# Patient Record
Sex: Male | Born: 1975 | Race: White | Hispanic: No | Marital: Single
Health system: Southern US, Community
[De-identification: ages and names within clinical notes are randomized; demographics above are authoritative.]

## PROBLEM LIST (undated history)

## (undated) DIAGNOSIS — N50812 Left testicular pain: Secondary | ICD-10-CM

## (undated) MED FILL — ONDANSETRON HCL 4MG TABS: 4 MG | 10 days supply | Qty: 10 | Fill #0 | Status: AC

## (undated) MED FILL — PAXLOVID 20 X 150 MG & TBPK: 20 x 150 MG & 10 x 100MG | 5 days supply | Qty: 30 | Fill #0 | Status: AC

---

## 2014-02-05 ENCOUNTER — Ambulatory Visit
Admit: 2014-02-05 | Discharge: 2014-02-05 | Payer: PRIVATE HEALTH INSURANCE | Attending: Specialist | Primary: Family Medicine

## 2014-02-05 DIAGNOSIS — Z3009 Encounter for other general counseling and advice on contraception: Secondary | ICD-10-CM

## 2014-02-05 MED ORDER — DIAZEPAM 10 MG PO TABS
10 MG | ORAL_TABLET | Freq: Once | ORAL | Status: AC
Start: 2014-02-05 — End: 2014-02-05

## 2014-02-05 MED ORDER — CEPHALEXIN 500 MG PO CAPS
500 MG | ORAL_CAPSULE | Freq: Three times a day (TID) | ORAL | Status: DC
Start: 2014-02-05 — End: 2015-03-09

## 2014-02-05 MED ORDER — IBUPROFEN 800 MG PO TABS
800 MG | ORAL_TABLET | Freq: Three times a day (TID) | ORAL | Status: DC | PRN
Start: 2014-02-05 — End: 2015-03-09

## 2014-02-05 NOTE — Progress Notes (Signed)
Tama Headings Sande Brothers, MD [facility]    Urology Consultation / New Patient Visit    Patient:  Jeremiah Goodman  Date of Birth: 07-28-1975  Date: 02/05/2014    HISTORY OF PRESENT ILLNESS:   The patient is a 39 y.o. male who presents today for evaluation of the following problems:   Request for sterilization via vasectomy:  The patient is here today to discuss a bilateral vasectomy for sterilization.  I explained to him that a vasectomy is one of the best methods of sterilization, but it is not a guarantee of sterilization.  I went into great detail concerning the method of performing a vasectomy in the office setting and the hospital.      Marital Status: married  Number of Children: 3  Current Method of Birth Control: wife pregnant  Spouse currently pregnant? yes - due in May  Personal History of Scrotal or Testicular trauma? no    Vasectomy Discussion:  The patient and I had a long discussion in regards to vasectomy.  The patient understands he is not safe for unprotected intercourse until I have personally viewed at least one semen specimen after vasectomy showing no sperm.  He was told he will need to wait at least 1 month or 15-20 ejaculations (which ever comes LAST) before bringing in a semen specimen after vasectomy. He understands this will be an outpatient procedure.  He understands that there is a possibility of recanalization of the vas deferens creating fertility (less than 1 per 1000).   He also understands that he should use ice, elevation, antiinflammatories and inactivity for two days following the vasectomy; no intercourse or exercise for at least 1 week.  He understands complication of: 1) swelling and bruising in the scrotal area and the testicles, 2) bleeding and hematoma formation, possibly requiring hospitalization and surgical drainage, 3) loss of a testicle, 4) infection, and 5) pain.  He understands long-term complications including: Possible recanalization of the vas producing fertility or chronic  pain whether it is minor or significant, even requiring surgical procedures including orchiectomy.  He understands that although this is a very effective procedure for sterilization that it is not 100% effective.       Associated Symptoms: No dysuria, gross hematuria.  Pain Severity: Pain Score:   0 - No pain/10    (Patient's old records have been requested, reviewed and summarized in today's note.)    Summary of old records:   Desires Bilateral Vasectomy    Procedures Today: N/A    Last several PSA's:  No results found for: PSA  Last total testosterone:  No results found for: TESTOSTERONE  Urinalysis today:  No results found for this visit on 02/05/14.    AUA Symptom Score (02/05/2014):  INCOMPLETE EMPTYING: How often have you had the sensation of not emptying your bladder?: Not at all  FREQUENCY: How often do you have to urinate less than every two hours?: About Half the time  INTERMITTENCY: How often have you found you stopped and started again several times when you urinated?: Not at all  URGENCY: How often have you found it difficult to postpone urination?: Not at all  WEAK STREAM: How often have you had a weak urinary stream?: Less than 1 to 5 times  STRAINING: How often have you had to strain to start  urination?: Not at all  NOCTURIA: How many times did you typically get up at night to uriniate?: NONE  TOTAL I-PSS SCORE:: 4  How would you feel if  you were to spend the rest of your life with your urinary condition?: Pleased    Last BUN and creatinine:  No results found for: BUN  No results found for: CREATININE    Additional Lab/Culture results: none    Imaging Reviewed during this Office Visit: none  (results were independently reviewed by physician and radiology report verified)    PAST MEDICAL, FAMILY AND SOCIAL HISTORY:  Past Medical History   Diagnosis Date   ??? Diverticulitis    ??? GERD (gastroesophageal reflux disease)    ??? Asthma    ??? Melanoma (HCC)    ??? Caffeine use      2 cans pop per day     Past  Surgical History   Procedure Laterality Date   ??? Colonoscopy     ??? Knee surgery       No family history on file.  Current Outpatient Prescriptions   Medication Sig Dispense Refill   ??? Multiple Vitamins-Minerals (THERAPEUTIC MULTIVITAMIN-MINERALS) tablet Take 1 tablet by mouth daily     ??? psyllium (METAMUCIL) 58.6 % packet Take 1 packet by mouth daily     ??? ibuprofen (ADVIL;MOTRIN) 800 MG tablet Take 1 tablet by mouth every 8 hours as needed for Pain Take 1 tab 1/2 prior to procedure and then every 8 hours after PRN 22 tablet 0   ??? cephALEXin (KEFLEX) 500 MG capsule Take 1 capsule by mouth 3 times daily 21 capsule 0   ??? diazepam (VALIUM) 10 MG tablet Take 1 tablet by mouth once for 1 dose Take 1 tablet 1/2 hour before procedure 1 tablet 0   ??? DEXILANT 60 MG CPDR capsule   0   ??? PROAIR HFA 108 (90 BASE) MCG/ACT inhaler   0   ??? hydrOXYzine (ATARAX) 10 MG/5ML syrup   0     No current facility-administered medications for this visit.       Other  History   Smoking status   ??? Never Smoker    Smokeless tobacco   ??? Never Used      (If patient a smoker, smoking cessation counseling offered)   History   Alcohol Use   ??? 0.6 oz/week   ??? 1 Not specified per week       REVIEW OF SYSTEMS:  Constitutional: negative  Eyes: positive for  glasses  Neurological: negative  Endocrine: negative  Gastrointestinal: negative  Cardiovascular: negative  Skin: negative   Musculoskeletal: negative  Ears/Nose/Throat: negative  Respiratory: negative  Hematological/Lymphatic: negative  Psychological: negative    Physical Exam:    This a 39 y.o. male   Filed Vitals:    02/05/14 1403   BP: 109/78   Pulse: 105     Body mass index is 30.11 kg/(m^2).  Constitutional: Patient in no acute distress;   Neuro: alert and oriented to person place and time.    Psych: Mood and affect normal.  Skin: Normal  Lungs: Respiratory effort normal  Cardiovascular:  Normal peripheral pulses  Abdomen: Soft, non-tender, non-distended with no CVA, flank pain,  hepatosplenomegaly or hernia.  Kidneys normal.  Bladder non-tender and not distended.  Lymphatics: no palpable lymphadenopathy  Penis normal and circumcised  Urethral meatus normal  Scrotal exam normal  Testicles normal bilaterally  Epididymis normal bilaterally  No evidence of inguinal hernia       Assessment and Plan      1. Sterilization consult           Plan:  Return for Vasectomy.  After discussing the role of bilateral vasectomy for sterilization in detail, the patient would like to proceed and will be scheduled under local anesthesia in my office in the near future.  Valium 10 mg po 1/2 hour prior to vasectomy for anxiety.  Motrin 800 mg po 1/2 hour prior to vasectomy and postop for pain.        Tama HeadingsGregory D. Vang Kraeger, MD FACS  02/05/2014 2:11 PM    2016 PQRS Documentation: N/A

## 2014-02-14 ENCOUNTER — Ambulatory Visit: Payer: Self-pay | Admitting: *Deleted

## 2014-04-04 ENCOUNTER — Ambulatory Visit
Admit: 2014-04-04 | Discharge: 2014-04-04 | Payer: PRIVATE HEALTH INSURANCE | Attending: Specialist | Primary: Family Medicine

## 2014-04-04 DIAGNOSIS — Z302 Encounter for sterilization: Secondary | ICD-10-CM

## 2014-04-04 MED ORDER — HYDROCODONE-ACETAMINOPHEN 7.5-325 MG PO TABS
ORAL_TABLET | Freq: Four times a day (QID) | ORAL | Status: DC | PRN
Start: 2014-04-04 — End: 2015-03-09

## 2014-04-04 NOTE — Progress Notes (Signed)
Tama Headings Sande Brothers, MD FACS    Urology Office Progress Note    Patient:  Jeremiah Goodman  Date of Birth: 1975-04-24  Date: 04/04/2014    HISTORY OF PRESENT ILLNESS:   The patient is a 39 y.o. male who was last seen on 02/05/14.    Elective Sterilization via Vasectomy  Patient presents today for follow-up for the following problems: elective sterilization via vasectomy.    Vasectomy Op Note    Surgeon: Tama Headings. Omarri Eich, MD, FACS  Procedure: Bilateral Vasectomy  Indications:  The patient has elected to proceed with bilateral vasectomy for purposes of sterilization.  The risks and benefits were discussed in detail, informed consent obtained and the patient wishes to proceed.    Procedure:  The patient was prepped and draped in the usual sterile fashion.  While in the supine position, the left vas deferens was isolated and the skin and vas were anesthetized using 1% lidocaine with epinephrine.  A small incision was made in the skin using a scalpel.  The vas was delivered up into the wound and a 2 cm segment was freed up and excised.  The proximal end was fulgerated and the distal end was suture ligated with a 3-0 Chromic suture.  The ends of the vas were placed back into the scrotum and the skin was closed with an interrupted 3-0 Chromic suture.    The right vas deferens was isolated and the skin and vas were anesthetized using 1% lidocaine with epinephrine.  A small incision was made in the skin using a scalpel.  The vas was delivered up into the wound and a 2 cm segment was freed up and excised.  The proximal end was fulgerated and the distal end was suture ligated with a 3-0 Chromic suture.  The ends of the vas were placed back into the scrotum and the skin was closed with an interrupted 3-0 Chromic suture.    The patient tolerated the procedure well and was discharged to home.    Post-Vasectomy Instructions:  Immediately after surgery the patient was instructed to be inactive for 48 hours using ice, elevation,  analgesics and to take all his antibiotics as prescribed.  He was told to avoid exercise, heavy lifting and sexual intercourse for the 1st week after his vasectomy.  The patient was told to use protected intercourse until he brings in a semen sample for my review.  He was told to wait at least 1 month or 15 ejaculations whichever comes last before bringing in a sample.    Associated Symptoms: No dysuria, gross hematuria.  Pain Severity: Pain Score:   0 - No pain/10    Summary of old records:   Bilateral Vasectomy 04/04/14    Additional History: none    Procedures Today: N/A    Urinalysis today:  No results found for this visit on 04/04/14.  Last several PSA's:  No results found for: PSA  Last total testosterone:  No results found for: TESTOSTERONE        Last BUN and creatinine:  No results found for: BUN  No results found for: CREATININE    Additional Lab/Culture results: none    Imaging Reviewed during this Office Visit: none  (results were independently reviewed by physician and radiology report verified)    PAST MEDICAL, FAMILY AND SOCIAL HISTORY UPDATE:  Past Medical History   Diagnosis Date   ??? Diverticulitis    ??? GERD (gastroesophageal reflux disease)    ??? Asthma    ???  Melanoma (HCC)    ??? Caffeine use      2 cans pop per day     Past Surgical History   Procedure Laterality Date   ??? Colonoscopy     ??? Knee surgery     ??? Vasectomy Bilateral 04/04/14     Family History   Problem Relation Age of Onset   ??? Family history unknown: Yes     Current Outpatient Prescriptions   Medication Sig Dispense Refill   ??? HYDROcodone-acetaminophen (NORCO) 7.5-325 MG per tablet Take 1 tablet by mouth every 6 hours as needed for Pain 24 tablet 0   ??? DEXILANT 60 MG CPDR capsule   0   ??? PROAIR HFA 108 (90 BASE) MCG/ACT inhaler   0   ??? hydrOXYzine (ATARAX) 10 MG/5ML syrup   0   ??? Multiple Vitamins-Minerals (THERAPEUTIC MULTIVITAMIN-MINERALS) tablet Take 1 tablet by mouth daily     ??? psyllium (METAMUCIL) 58.6 % packet Take 1 packet by  mouth daily     ??? ibuprofen (ADVIL;MOTRIN) 800 MG tablet Take 1 tablet by mouth every 8 hours as needed for Pain Take 1 tab 1/2 prior to procedure and then every 8 hours after PRN 22 tablet 0   ??? cephALEXin (KEFLEX) 500 MG capsule Take 1 capsule by mouth 3 times daily 21 capsule 0     No current facility-administered medications for this visit.       Other  History   Smoking status   ??? Never Smoker    Smokeless tobacco   ??? Never Used     (If patient a smoker, smoking cessation counseling offered)    History   Alcohol Use   ??? 0.6 oz/week   ??? 1 Not specified per week       REVIEW OF SYSTEMS:  Constitutional: negative  Eyes: negative  Neurological: negative  Endocrine: negative  Gastrointestinal: negative  Cardiovascular: negative  Skin: negative   Musculoskeletal: negative  Ears/Nose/Throat: negative  Respiratory: negative  Hematological/Lymphatic: negative  Psychological: negative    Physical Exam:    There were no vitals filed for this visit.  Body mass index is 30.11 kg/(m^2).  Patient is a 39 y.o. male in no acute distress and alert and oriented to person, place and time.      Assessment and Plan      1. Encounter for sterilization           Plan:      Return in 3 weeks for a  postop check, for follow up.  Patient is s/p vasectomy today  He was told to restrict his activity for the next 48-72 hours.  He was told that he and his wife should continue using birth control protection until he brings back a semen sample which demonstrates no residual sperm (after 15 ejaculations or 1 month whichever comes last).         Tama HeadingsGregory D. Mashonda Broski, MD FACS  04/04/2014 1:33 PM    2016 PQRS Documentation: N/A

## 2014-04-25 ENCOUNTER — Ambulatory Visit
Admit: 2014-04-25 | Discharge: 2014-04-25 | Payer: PRIVATE HEALTH INSURANCE | Attending: Specialist | Primary: Family Medicine

## 2014-04-25 DIAGNOSIS — Z9852 Vasectomy status: Secondary | ICD-10-CM

## 2014-04-25 NOTE — Progress Notes (Signed)
Tama HeadingsGregory D. Sande BrothersHaselhuhn, MD FACS    Urology Postoperative Note    Patient:  Jeremiah HopeMichael Seivert  Date of Birth: January 08, 1976  Date: 04/25/2014    HISTORY OF PRESENT ILLNESS:   Patient is doing well s/p vasectomy 04/04/14.  Pain Severity: Pain Score:   0 - No pain/10    Summary of old records:   Bilateral Vasectomy 04/04/14    Urinalysis today:  No results found for this visit on 04/25/14.      PAST MEDICAL, FAMILY AND SOCIAL HISTORY UPDATE:  Past Medical History   Diagnosis Date   ??? Diverticulitis    ??? GERD (gastroesophageal reflux disease)    ??? Asthma    ??? Melanoma (HCC)    ??? Caffeine use      2 cans pop per day     Past Surgical History   Procedure Laterality Date   ??? Colonoscopy     ??? Knee surgery     ??? Vasectomy Bilateral 04/04/14     Family History   Problem Relation Age of Onset   ??? Family history unknown: Yes     Current Outpatient Prescriptions   Medication Sig Dispense Refill   ??? azithromycin (ZITHROMAX) 250 MG tablet   0   ??? predniSONE (DELTASONE) 20 MG tablet   0   ??? DYMISTA 137-50 MCG/ACT SUSP   0   ??? DEXILANT 60 MG CPDR capsule   0   ??? PROAIR HFA 108 (90 BASE) MCG/ACT inhaler   0   ??? hydrOXYzine (ATARAX) 10 MG/5ML syrup   0   ??? Multiple Vitamins-Minerals (THERAPEUTIC MULTIVITAMIN-MINERALS) tablet Take 1 tablet by mouth daily     ??? psyllium (METAMUCIL) 58.6 % packet Take 1 packet by mouth daily     ??? ibuprofen (ADVIL;MOTRIN) 800 MG tablet Take 1 tablet by mouth every 8 hours as needed for Pain Take 1 tab 1/2 prior to procedure and then every 8 hours after PRN 22 tablet 0   ??? HYDROcodone-acetaminophen (NORCO) 7.5-325 MG per tablet Take 1 tablet by mouth every 6 hours as needed for Pain 24 tablet 0   ??? cephALEXin (KEFLEX) 500 MG capsule Take 1 capsule by mouth 3 times daily 21 capsule 0     No current facility-administered medications for this visit.       Other  History   Smoking status   ??? Never Smoker    Smokeless tobacco   ??? Never Used       History   Alcohol Use   ??? 0.6 oz/week   ??? 1 Not specified per week        Physical Exam:    There were no vitals filed for this visit.  There is no weight on file to calculate BMI.  Patient is a 39 y.o. male in no acute distress and alert and oriented to person, place and time.      Assessment and Plan      1. S/P vasectomy           Plan:      Return if symptoms worsen or fail to improve, for follow up.  He was told that he and his wife should continue using birth control protection until he brings back a semen sample which demonstrates no residual sperm (after 15 ejaculations or 1 month whichever comes last).       Tama HeadingsGregory D. Yousof Alderman, MD FACS  04/25/2014 9:42 AM    2016 PQRS Documentation: N/A

## 2014-06-18 NOTE — Telephone Encounter (Signed)
Semen analysis negative for sperm; notify patient.

## 2014-06-18 NOTE — Telephone Encounter (Signed)
PT. NOTIFIED-KSE

## 2014-06-18 NOTE — Telephone Encounter (Signed)
PT. DROPPED OFF SPECIMEN FOR SEMEN ANALYSIS-KSE

## 2015-03-09 ENCOUNTER — Ambulatory Visit
Admit: 2015-03-09 | Discharge: 2015-03-09 | Payer: PRIVATE HEALTH INSURANCE | Attending: Specialist | Primary: Family Medicine

## 2015-03-09 DIAGNOSIS — N50812 Left testicular pain: Secondary | ICD-10-CM

## 2015-03-09 LAB — POCT URINALYSIS DIPSTICK W/O MICROSCOPE (AUTO)
Glucose, UA POC: NEGATIVE
Leukocytes, UA: NEGATIVE
Nitrite, UA: NEGATIVE
Protein, UA POC: NEGATIVE

## 2015-03-09 MED ORDER — CIPROFLOXACIN HCL 500 MG PO TABS
500 MG | ORAL_TABLET | Freq: Two times a day (BID) | ORAL | 0 refills | Status: DC
Start: 2015-03-09 — End: 2015-05-18

## 2015-03-09 NOTE — Progress Notes (Signed)
Tama Headings Sande Brothers, MD FACS    Urology Office Progress Note    Patient:  Jeremiah Goodman  Date of Birth: November 07, 1975  Date: 03/09/2015    HISTORY OF PRESENT ILLNESS:   The patient is a 40 y.o. male who was last seen on 04-25-2014.    Scrotal / Testicular Pain:  Patient is here today for a scrotal/testicular pain which was first noted 3 week(s) ago.    The pain is: left.  Recently the symptoms: show no change  Current medical Rx for pain: none  Recent imaging results: none  Lower urinary tract symptoms: urgency, frequency and decreased urinary stream  Associated Symptoms: No dysuria, gross hematuria.  Pain Severity: Pain Score:   7/10    Summary of old records:  Bilateral Vasectomy 04/04/14  Painful left sperm granuloma: Rx with Cipro for 1 month, sitz baths and Aleve bid    Additional History: Patient presents with a 3 week history of pain in left testicle.  His is s/p bilateral vasectomy 04/04/14 and appears to have a painful left sperm granuloma.  I recommended Cipro 500 mg po bid for one month, Aleve bid for 10 days then prn thereafter.  Also recommended Sitz baths as needed for symptomatic relief.     Procedures Today: N/A    Urinalysis today:  Results for POC orders placed in visit on 03/09/15   POCT Urinalysis No Micro (Auto)   Result Value Ref Range    Color, UA yellow     Clarity, UA clear     Glucose, UA POC negative     Bilirubin, UA      Ketones, UA      Spec Grav, UA      Blood, UA POC trace-intact     pH, UA      Protein, UA POC negative     Urobilinogen, UA      Leukocytes, UA negative     Nitrite, UA negative      Last several PSA's:  No results found for: PSA  Last total testosterone:  No results found for: TESTOSTERONE    AUA Symptom Score (03/09/2015):  INCOMPLETE EMPTYING: How often have you had the sensation of not emptying your bladder?: Less than 1 to 5 times  FREQUENCY: How often do you have to urinate less than every two hours?: More than Half the time  INTERMITTENCY: How often have you found you  stopped and started again several times when you urinated?: About Half the time  URGENCY: How often have you found it difficult to postpone urination?: About Half the time  WEAK STREAM: How often have you had a weak urinary stream?: More than Half the time  STRAINING: How often have you had to strain to start  urination?: Less than Half the time  NOCTURIA: How many times did you typically get up at night to uriniate?: NONE  TOTAL I-PSS SCORE:: 17  How would you feel if you were to spend the rest of your life with your urinary condition?: Mostly Dissatisfied    Last AUA Symptom Score (QOL): 4 (1)    Last BUN and creatinine:  No results found for: BUN  No results found for: CREATININE    Additional Lab/Culture results: none    Imaging Reviewed during this Office Visit: none  (results were independently reviewed by physician and radiology report verified)    PAST MEDICAL, FAMILY AND SOCIAL HISTORY UPDATE:  Past Medical History   Diagnosis Date   ??? Asthma    ???  Caffeine use      2 cans pop per day   ??? Diverticulitis    ??? GERD (gastroesophageal reflux disease)    ??? Melanoma [facility])      Past Surgical History   Procedure Laterality Date   ??? Colonoscopy     ??? Knee surgery     ??? Vasectomy Bilateral 04/04/14   ??? Vasectomy       Family History   Problem Relation Age of Onset   ??? Family history unknown: Yes     Current Outpatient Prescriptions   Medication Sig Dispense Refill   ??? Cholecalciferol (VITAMIN D3) 5000 UNITS TABS take 1 tablet by mouth once daily with meals  0   ??? pantoprazole (PROTONIX) 40 MG tablet take 1 tablet by mouth once daily  0   ??? ciprofloxacin (CIPRO) 500 MG tablet Take 1 tablet by mouth 2 times daily 60 tablet 0   ??? DYMISTA 137-50 MCG/ACT SUSP   0   ??? PROAIR HFA 108 (90 BASE) MCG/ACT inhaler   0   ??? hydrOXYzine (ATARAX) 10 MG/5ML syrup   0   ??? azelastine (ASTELIN) 0.1 % nasal spray instill 1 spray into each nostril twice a day  0   ??? fluticasone (FLONASE) 50 MCG/ACT nasal spray instill 1 spray into each  nostril twice a day  0     No current facility-administered medications for this visit.        Iodine and Other  History   Smoking Status   ??? Never Smoker   Smokeless Tobacco   ??? Never Used     (If patient a smoker, smoking cessation counseling offered)    History   Alcohol Use   ??? 0.6 oz/week   ??? 1 Standard drinks or equivalent per week       REVIEW OF SYSTEMS:  Constitutional: negative  Eyes: negative  Neurological: negative  Endocrine: negative  Gastrointestinal: negative  Cardiovascular: negative  Skin: negative   Musculoskeletal: negative  Ears/Nose/Throat: negative  Respiratory: negative  Hematological/Lymphatic: negative  Psychological: negative    Physical Exam:      Vitals:    03/09/15 1115   BP: (!) 124/91   Pulse: 94     Body mass index is 29.65 kg/(m^2).  Patient is a 40 y.o. male in no acute distress and alert and oriented to person, place and time.  Tender left sperm granuloma      Assessment and Plan      1. Testicular pain, left    2. Sperm granuloma           Plan:      Return in about 4 weeks (around 04/06/2015).  Patient presents with a 3 week history of pain in left testicle.    His is s/p bilateral vasectomy 04/04/14 and appears to have a painful left sperm granuloma.    I recommended Cipro 500 mg po bid for one month, Aleve bid for 10 days then prn thereafter.    Also recommended Sitz baths as needed for symptomatic relief.        Tama Headings Kris No, MD FACS  03/09/2015 11:29 AM    2017 PQRS Documentation: N/A

## 2015-03-09 NOTE — Patient Instructions (Signed)
Sitz baths every day for 10 days then as needed for symptomatic relief of left testicular pain.   Aleve twice a day for 10 days then as needed thereafter.  Cipro 500 mg twice a day for one month.

## 2015-04-06 ENCOUNTER — Ambulatory Visit
Admit: 2015-04-06 | Discharge: 2015-04-06 | Payer: PRIVATE HEALTH INSURANCE | Attending: Specialist | Primary: Family Medicine

## 2015-04-06 DIAGNOSIS — N50812 Left testicular pain: Secondary | ICD-10-CM

## 2015-04-06 NOTE — Progress Notes (Signed)
Tama HeadingsGregory D. Sande BrothersHaselhuhn, MD FACS    Urology Office Progress Note    Patient:  Jeremiah HopeMichael Stolarz  Date of Birth: 09/08/1975  Date: 04/06/2015    HISTORY OF PRESENT ILLNESS:   The patient is a 40 y.o. male who was last seen on 03/09/15.    Scrotal / Testicular Pain:  Patient is here today for a scrotal/testicular pain which was first noted several month(s) ago.    The pain is: left.  Recently the symptoms: show no change  Current medical Rx for pain: see below  Recent imaging results: none  Lower urinary tract symptoms: urgency, frequency and decreased urinary stream  Associated Symptoms: No dysuria, gross hematuria.  Pain Severity: Pain Score:   6 (left testicle)/10    Summary of old records:   Bilateral Vasectomy 04/04/14  Painful left sperm granuloma: Rx with Cipro for 1 month, sitz baths and Aleve bid with little improvement    Additional History: Patient has persistent left testicular pain despite Cipro, Aleve and sitz baths.  He has a persistent palpably tender left sperm granuloma.  We discussed the role of surgery and he would like to proceed.  He understands he may have persistent pain despite the surgery.  I will get a scrotal U/S with doppler preop to rule out any other pathology.    Procedures Today: N/A    Urinalysis today:  No results found for this visit on 04/06/15.  Last several PSA's:  No results found for: PSA  Last total testosterone:  No results found for: TESTOSTERONE    AUA Symptom Score (04/06/2015):  INCOMPLETE EMPTYING: How often have you had the sensation of not emptying your bladder?: More than Half the time  FREQUENCY: How often do you have to urinate less than every two hours?: More than Half the time  INTERMITTENCY: How often have you found you stopped and started again several times when you urinated?: Almost always  URGENCY: How often have you found it difficult to postpone urination?: More than Half the time  WEAK STREAM: How often have you had a weak urinary stream?: More than Half the  time  STRAINING: How often have you had to strain to start  urination?: More than Half the time  NOCTURIA: How many times did you typically get up at night to uriniate?: NONE  TOTAL I-PSS SCORE:: 25  How would you feel if you were to spend the rest of your life with your urinary condition?: Mostly Dissatisfied    Last AUA Symptom Score (QOL): 17 (4)    Last BUN and creatinine:  No results found for: BUN  No results found for: CREATININE    Additional Lab/Culture results: none    Imaging Reviewed during this Office Visit: none  (results were independently reviewed by physician and radiology report verified)    PAST MEDICAL, FAMILY AND SOCIAL HISTORY UPDATE:  Past Medical History   Diagnosis Date   ??? Asthma    ??? Caffeine use      2 cans pop per day   ??? Diverticulitis    ??? GERD (gastroesophageal reflux disease)    ??? Melanoma [facility](HCC)      Past Surgical History   Procedure Laterality Date   ??? Colonoscopy     ??? Knee surgery     ??? Vasectomy Bilateral 04/04/14   ??? Vasectomy       Family History   Problem Relation Age of Onset   ??? Family history unknown: Yes     Current  Outpatient Prescriptions   Medication Sig Dispense Refill   ??? Cholecalciferol (VITAMIN D3) 5000 UNITS TABS take 1 tablet by mouth once daily with meals  0   ??? pantoprazole (PROTONIX) 40 MG tablet take 1 tablet by mouth once daily  0   ??? azelastine (ASTELIN) 0.1 % nasal spray instill 1 spray into each nostril twice a day  0   ??? fluticasone (FLONASE) 50 MCG/ACT nasal spray instill 1 spray into each nostril twice a day  0   ??? ciprofloxacin (CIPRO) 500 MG tablet Take 1 tablet by mouth 2 times daily 60 tablet 0   ??? PROAIR HFA 108 (90 BASE) MCG/ACT inhaler   0   ??? hydrOXYzine (ATARAX) 10 MG/5ML syrup   0     No current facility-administered medications for this visit.        Iodine and Other  History   Smoking Status   ??? Never Smoker   Smokeless Tobacco   ??? Never Used     (If patient a smoker, smoking cessation counseling offered)    History   Alcohol Use   ??? 0.6  oz/week   ??? 1 Standard drinks or equivalent per week       REVIEW OF SYSTEMS:  Constitutional: negative  Eyes: negative  Neurological: negative  Endocrine: negative  Gastrointestinal: negative  Cardiovascular: negative  Skin: negative   Musculoskeletal: negative  Ears/Nose/Throat: negative  Respiratory: negative  Hematological/Lymphatic: negative  Psychological: negative    Physical Exam:    There were no vitals filed for this visit.  There is no height or weight on file to calculate BMI.  Patient is a 40 y.o. male in no acute distress and alert and oriented to person, place and time.  Palpably tender left sperm granuloma      Assessment and Plan      1. Testicular pain, left    2. Sperm granuloma           Plan:      Patient has persistent left testicular pain despite Cipro, Aleve and sitz baths.    He has a persistent palpably tender left sperm granuloma.    We discussed the role of surgery and he would like to proceed.    He understands he may have persistent pain despite the surgery.    I will get a scrotal U/S with doppler preop to rule out any other pathology.         Tama Headings Monica Codd, MD FACS  04/06/2015 10:33 AM    2017 PQRS Documentation: N/A

## 2015-04-09 ENCOUNTER — Encounter

## 2015-05-18 ENCOUNTER — Ambulatory Visit
Admit: 2015-05-18 | Discharge: 2015-05-18 | Payer: PRIVATE HEALTH INSURANCE | Attending: Specialist | Primary: Family Medicine

## 2015-05-18 DIAGNOSIS — N5089 Other specified disorders of the male genital organs: Secondary | ICD-10-CM

## 2015-05-18 LAB — POCT URINALYSIS DIPSTICK W/O MICROSCOPE (AUTO)
Glucose, UA POC: NEGATIVE
Leukocytes, UA: NEGATIVE
Nitrite, UA: NEGATIVE
Protein, UA POC: NEGATIVE

## 2015-05-18 NOTE — Progress Notes (Signed)
Tama Headings Sande Brothers, MD FACS    Urology Postoperative Note    Patient:  Jeremiah Goodman  Date of Birth: Oct 01, 1975  Date: 05/18/2015    HISTORY OF PRESENT ILLNESS:   Patient is healing nicely after his 04/28/15 excision of left sperm granuloma.  Only time will tell his his chronic left testicular pain resolves.  Pain Severity: Pain Score:   0 - No pain/10    Summary of old records:   Bilateral Vasectomy 04/04/14  Painful left sperm granuloma: 04/28/15 excision of left sperm granuloma    Urinalysis today:  Results for POC orders placed in visit on 05/18/15   POCT Urinalysis No Micro (Auto)   Result Value Ref Range    Color, UA Amber     Clarity, UA Clear     Glucose, UA POC Negative     Bilirubin, UA      Ketones, UA      Spec Grav, UA      Blood, UA POC Trace-intact     pH, UA      Protein, UA POC Negative     Urobilinogen, UA      Leukocytes, UA Negative     Nitrite, UA Negative        AUA Symptom Score (05/18/2015):  INCOMPLETE EMPTYING: How often have you had the sensation of not emptying your bladder?: Less than Half the time  FREQUENCY: How often do you have to urinate less than every two hours?: More than Half the time  INTERMITTENCY: How often have you found you stopped and started again several times when you urinated?: More than Half the time  URGENCY: How often have you found it difficult to postpone urination?: More than Half the time  WEAK STREAM: How often have you had a weak urinary stream?: More than Half the time  STRAINING: How often have you had to strain to start  urination?: About Half the time  NOCTURIA: How many times did you typically get up at night to uriniate?: 1 Time  TOTAL I-PSS SCORE:: 22  How would you feel if you were to spend the rest of your life with your urinary condition?: Mixe    PAST MEDICAL, FAMILY AND SOCIAL HISTORY UPDATE:  Past Medical History:   Diagnosis Date   ??? Asthma    ??? Caffeine use     2 cans pop per day   ??? Diverticulitis    ??? GERD (gastroesophageal reflux disease)    ???  Melanoma [facility])      Past Surgical History:   Procedure Laterality Date   ??? COLONOSCOPY     ??? KNEE SURGERY     ??? OTHER SURGICAL HISTORY  04/28/2015    Excision of left sperm granuloma   ??? VASECTOMY Bilateral 04/04/14     Family History   Problem Relation Age of Onset   ??? Family history unknown: Yes     Current Outpatient Prescriptions   Medication Sig Dispense Refill   ??? cephALEXin (KEFLEX) 500 MG capsule Take 500 mg by mouth 2 times daily     ??? mupirocin (BACTROBAN) 2 % cream Apply topically 3 times daily Apply topically 3 times daily.     ??? Cholecalciferol (VITAMIN D3) 5000 UNITS TABS take 1 tablet by mouth once daily with meals  0   ??? pantoprazole (PROTONIX) 40 MG tablet take 1 tablet by mouth once daily  0   ??? azelastine (ASTELIN) 0.1 % nasal spray instill 1 spray into each nostril twice  a day  0   ??? fluticasone (FLONASE) 50 MCG/ACT nasal spray instill 1 spray into each nostril twice a day  0   ??? PROAIR HFA 108 (90 BASE) MCG/ACT inhaler   0   ??? hydrOXYzine (ATARAX) 10 MG/5ML syrup   0     No current facility-administered medications for this visit.        Iodine and Other  History   Smoking Status   ??? Never Smoker   Smokeless Tobacco   ??? Never Used       History   Alcohol Use   ??? 0.6 oz/week   ??? 1 Standard drinks or equivalent per week       Physical Exam:    There were no vitals filed for this visit.  There is no height or weight on file to calculate BMI.  Patient is a 40 y.o. male in no acute distress and alert and oriented to person, place and time.      Assessment and Plan      1. Sperm granuloma    2. Testicular pain, left           Plan:      Return in about 3 months (around 08/18/2015).  Patient is healing nicely after his 04/28/15 excision of left sperm granuloma.    Only time will tell his his chronic left testicular pain resolves.     Tama HeadingsGregory D. Cayli Escajeda, MD FACS  05/18/2015 11:12 AM    2017 PQRS Documentation: N/A

## 2015-06-12 NOTE — Telephone Encounter (Signed)
Patient calls, he is a s/p excision of left sperm granuloma 04-28-15, states he if beginning to feel another hard lump, please advise

## 2015-06-12 NOTE — Telephone Encounter (Signed)
That's the normal healing process.  May take 8-12 weeks to resolve.

## 2015-06-12 NOTE — Telephone Encounter (Signed)
Patient informed

## 2015-08-19 ENCOUNTER — Ambulatory Visit
Admit: 2015-08-19 | Discharge: 2015-08-19 | Payer: PRIVATE HEALTH INSURANCE | Attending: Specialist | Primary: Family Medicine

## 2015-08-19 DIAGNOSIS — N5089 Other specified disorders of the male genital organs: Secondary | ICD-10-CM

## 2015-08-19 LAB — POCT URINALYSIS DIPSTICK W/O MICROSCOPE (AUTO)
Blood, UA POC: NEGATIVE
Glucose, UA POC: NEGATIVE
Leukocytes, UA: NEGATIVE
Nitrite, UA: NEGATIVE
Protein, UA POC: NEGATIVE

## 2015-08-19 MED ORDER — TADALAFIL 5 MG PO TABS
5 | ORAL_TABLET | ORAL | 0 refills | Status: DC | PRN
Start: 2015-08-19 — End: 2017-08-14

## 2015-08-19 NOTE — Progress Notes (Signed)
Tama Headings Sande Brothers, MD FACS    Urology Office Progress Note    Patient:  Jeremiah Goodman  Date of Birth: 04/14/1975  Date: 08/19/2015    HISTORY OF PRESENT ILLNESS:   The patient is a 40 y.o. male who was last seen on 05/18/15.    Scrotal / Testicular Pain:  Patient is here today for a scrotal/testicular pain which was first noted several month(s) ago.    The pain is: left.  Recently the symptoms: are improving  Current medical Rx for pain: none  Recent imaging results: none  Lower urinary tract symptoms: urgency, frequency and decreased urinary stream  Associated Symptoms: No dysuria, gross hematuria.  Pain Severity: Pain Score:   2 (surgical site (exc of left sperm granuloma))/10    Summary of old records:   Bilateral Vasectomy 04/04/14  Painful left sperm granuloma: 04/28/15 excision of left sperm granuloma  Frequency every 1.5 hrs; ordered void diary; avoid pop/tea  ED: Viagra 50 mg didn't help; gave samples of Cialis 5 mg    Additional History: Patient still having mild discomfort s/p excision of left sperm granuloma 04/28/15.   His PE today was normal.  He has frequency every 1.5 hrs. Patient instructed to avoid bladder irritants in diet such as coffee, tea, caffeine, alcohol, carbonated beverages, spicy/acidic foods.  Patient given a list of these to avoid. Patient was instructed to keep a voiding diary for 3 days in order to document urine output, frequency of urination and functional bladder capacity.   Patient given samples of Cialis 5-20 mg as needed for Erectile Dysfunction.    Procedures Today: N/A    Urinalysis today:  Results for POC orders placed in visit on 08/19/15   POCT Urinalysis No Micro (Auto)   Result Value Ref Range    Color, UA yellow     Clarity, UA clear     Glucose, UA POC negative     Bilirubin, UA      Ketones, UA      Spec Grav, UA      Blood, UA POC negative     pH, UA      Protein, UA POC negative     Urobilinogen, UA      Leukocytes, UA negative     Nitrite, UA negative      Last  several PSA's:  No results found for: PSA  Last total testosterone:  No results found for: TESTOSTERONE    AUA Symptom Score (08/19/2015):  INCOMPLETE EMPTYING: How often have you had the sensation of not emptying your bladder?: Less than 1 to 5 times  FREQUENCY: How often do you have to urinate less than every two hours?: More than Half the time  INTERMITTENCY: How often have you found you stopped and started again several times when you urinated?: More than Half the time  URGENCY: How often have you found it difficult to postpone urination?: Almost always  WEAK STREAM: How often have you had a weak urinary stream?: More than Half the time  STRAINING: How often have you had to strain to start  urination?: Less than Half the time  NOCTURIA: How many times did you typically get up at night to uriniate?: NONE  TOTAL I-PSS SCORE:: 20  How would you feel if you were to spend the rest of your life with your urinary condition?: Mixe    Last AUA Symptom Score (QOL): 22 (3)    Last BUN and creatinine:  No results found for: BUN  No results found for: CREATININE    Additional Lab/Culture results: none    Imaging Reviewed during this Office Visit: none  (results were independently reviewed by physician and radiology report verified)    PAST MEDICAL, FAMILY AND SOCIAL HISTORY UPDATE:  Past Medical History:   Diagnosis Date   ??? Asthma    ??? Caffeine use     2 cans pop per day   ??? Diverticulitis    ??? GERD (gastroesophageal reflux disease)    ??? Mel[facility]oma [facility])      Past Surgical History:   Procedure Laterality Date   ??? COLONOSCOPY     ??? KNEE SURGERY     ??? OTHER SURGICAL HISTORY  04/28/2015    Excision of left sperm granuloma   ??? VASECTOMY Bilateral 04/04/14     Family History   Problem Relation Age of Onset   ??? Family history unknown: Yes     Current Outpatient Prescriptions   Medication Sig Dispense Refill   ??? RA VITAMIN D-3 5000 units CAPS capsule   0   ??? tadalafil (CIALIS) 5 MG tablet Take 1 tablet by mouth as needed for Erectile  Dysfunction 30 tablet 0   ??? pantoprazole (PROTONIX) 40 MG tablet take 1 tablet by mouth once daily  0   ??? azelastine (ASTELIN) 0.1 % nasal spray instill 1 spray into each nostril twice a day  0   ??? fluticasone (FLONASE) 50 MCG/ACT nasal spray instill 1 spray into each nostril twice a day  0   ??? PROAIR HFA 108 (90 BASE) MCG/ACT inhaler   0   ??? hydrOXYzine (ATARAX) 10 MG/5ML syrup   0     No current facility-administered medications for this visit.        Iodine and Other  History   Smoking Status   ??? Never Smoker   Smokeless Tobacco   ??? Never Used     (If patient a smoker, smoking cessation counseling offered)    History   Alcohol Use   ??? 0.6 oz/week   ??? 1 Standard drinks or equivalent per week       REVIEW OF SYSTEMS:  Constitutional: negative  Eyes: negative  Neurological: negative  Endocrine: negative  Gastrointestinal: negative  Cardiovascular: negative  Skin: negative   Musculoskeletal: negative  Ears/Nose/Throat: negative  Respiratory: negative  Hematological/Lymphatic: negative  Psychological: negative    Physical Exam:    There were no vitals filed for this visit.  There is no height or weight on file to calculate BMI.  Patient is a 40 y.o. male in no acute distress and alert and oriented to person, place and time.  Normal scrotal exam      Assessment and Plan      1. Sperm granuloma    2. Testicular pain, left    3. Frequency of micturition    4. Erectile dysfunction due to diseases classified elsewhere           Plan:      Return in about 4 weeks (around 09/16/2015) for Uroflow, PVR by U/S.  Patient still having mild discomfort s/p excision of left sperm granuloma 04/28/15.     His PE today was normal.    He has frequency every 1.5 hrs.   Patient instructed to avoid bladder irritants in diet such as coffee, tea, caffeine, alcohol, carbonated beverages, spicy/acidic foods.  Patient given a list of these to avoid.   Patient was instructed to keep a voiding diary for 3  days in order to document urine output,  frequency of urination and functional bladder capacity.     Patient given samples of Cialis 5-20 mg as needed for Erectile Dysfunction.         Tama Headings Keiondre Colee, MD FACS  08/19/2015 11:20 AM    2017 PQRS Documentation: N/A

## 2015-09-16 ENCOUNTER — Ambulatory Visit
Admit: 2015-09-16 | Discharge: 2015-09-16 | Payer: PRIVATE HEALTH INSURANCE | Attending: Specialist | Primary: Family Medicine

## 2015-09-16 DIAGNOSIS — R350 Frequency of micturition: Secondary | ICD-10-CM

## 2015-09-16 MED ORDER — TADALAFIL 10 MG PO TABS
10 | ORAL_TABLET | ORAL | 3 refills | Status: AC | PRN
Start: 2015-09-16 — End: ?

## 2015-09-16 NOTE — Progress Notes (Signed)
Jeremiah HeadingsGregory D. Sande BrothersHaselhuhn, MD FACS    Urology Office Progress Note    Patient:  Jeremiah Goodman  Date of Birth: July 21, 1975  Date: 09/16/2015    HISTORY OF PRESENT ILLNESS:   The patient is a 40 y.o. male who was last seen on 08/19/15.    Overactive Bladder:  Patient is here today for an overactive bladder which started several month(s) ago.   Recently the OAB symptoms: show no change  Current medical Rx for OAB: none  Frequency: 2 hours  Nocturia x 0   Consumption of bladder irritants? no  Incontinence?  Stress incontinence: Severity = not present.  Urge Incontinence:  Severity = not present.  Number of pads per day: 0     Associated Symptoms: No dysuria, gross hematuria.  Pain Severity: Pain Score:   0 - No pain/10    Summary of old records:   Bilateral Vasectomy 04/04/14  Painful left sperm granuloma: 04/28/15 excision of left sperm granuloma; pain resolved 09/16/15  OAB, Frequency every 1.5 hrs; 09/09/15 VD: MVV=500 mL, TVV=1285 mL/d, TV=8 voids/d, AVV=161 mL/void, Nx0.33, NPi=34%, NUP=50 mL.hr; avoid pop/tea; 09/16/15 PVR = 26.4 mL; uroflow: 228 mL, 16.3/9.5 mL/sec  ED: Viagra 50 mg didn't help; Cialis 10 mg worked well    Additional History: Patient's OAB symptoms (frequency) are not bad enough to warrant medical Rx. Patient instructed to avoid bladder irritants in diet such as coffee, tea, caffeine, alcohol, carbonated beverages, spicy/acidic foods.  Patient given an Rx for Cialis 10 mg as needed for Erectile Dysfunction.  Patient no longer has any pain from his excised left sperm granuloma.    Procedures Today:Uroflow Study (09/16/15):  Indications: frequency  Amount Voided: 228 mL  Time to Max Flow: 9 sec  Maximum Flow Rate: 16.3 mL/sec  Average Flow Rate: 9.5 mL/sec  Interpretation: good flow    Postvoid Residual:  Post-void Residual by bladder scanner: 26.4 mL (09/16/15)    Urinalysis today:  No results found for this visit on 09/16/15.  Last several PSA's:  No results found for: PSA  Last total testosterone:  No results  found for: TESTOSTERONE    AUA Symptom Score (09/16/2015):  INCOMPLETE EMPTYING: How often have you had the sensation of not emptying your bladder?: More than Half the time  FREQUENCY: How often do you have to urinate less than every two hours?: More than Half the time  INTERMITTENCY: How often have you found you stopped and started again several times when you urinated?: About Half the time  URGENCY: How often have you found it difficult to postpone urination?: More than Half the time  WEAK STREAM: How often have you had a weak urinary stream?: More than Half the time  STRAINING: How often have you had to strain to start  urination?: About Half the time  NOCTURIA: How many times did you typically get up at night to uriniate?: NONE  TOTAL I-PSS SCORE:: 22  How would you feel if you were to spend the rest of your life with your urinary condition?: Mixe    Last AUA Symptom Score (QOL): 20 (3)    Last BUN and creatinine:  No results found for: BUN  No results found for: CREATININE    Additional Lab/Culture results: none    Imaging Reviewed during this Office Visit: none  (results were independently reviewed by physician and radiology report verified)    PAST MEDICAL, FAMILY AND SOCIAL HISTORY UPDATE:  Past Medical History:   Diagnosis Date   ??? Asthma    ???  Caffeine use     2 cans pop per day   ??? Diverticulitis    ??? GERD (gastroesophageal reflux disease)    ??? [facility])      Past Surgical History:   Procedure Laterality Date   ??? COLONOSCOPY     ??? KNEE SURGERY     ??? OTHER SURGICAL HISTORY  04/28/2015    Excision of left sperm granuloma   ??? VASECTOMY Bilateral 04/04/14     Family History   Problem Relation Age of Onset   ??? Family history unknown: Yes     Current Outpatient Prescriptions   Medication Sig Dispense Refill   ??? omeprazole (PRILOSEC) 10 MG delayed release capsule Take 10 mg by mouth     ??? DilTIAZem HCl POWD      ??? tadalafil (CIALIS) 10 MG tablet Take 1 tablet by mouth as needed for Erectile Dysfunction 30  tablet 3   ??? RA VITAMIN D-3 5000 units CAPS capsule   0   ??? tadalafil (CIALIS) 5 MG tablet Take 1 tablet by mouth as needed for Erectile Dysfunction 30 tablet 0   ??? azelastine (ASTELIN) 0.1 % nasal spray instill 1 spray into each nostril twice a day  0   ??? fluticasone (FLONASE) 50 MCG/ACT nasal spray instill 1 spray into each nostril twice a day  0   ??? PROAIR HFA 108 (90 BASE) MCG/ACT inhaler   0   ??? hydrOXYzine (ATARAX) 10 MG/5ML syrup   0     No current facility-administered medications for this visit.        Iodine and Other  History   Smoking Status   ??? Never Smoker   Smokeless Tobacco   ??? Never Used     (If patient a smoker, smoking cessation counseling offered)    History   Alcohol Use   ??? 0.6 oz/week   ??? 1 Standard drinks or equivalent per week       REVIEW OF SYSTEMS:  Constitutional: negative  Eyes: negative  Neurological: negative  Endocrine: negative  Gastrointestinal: negative  Cardiovascular: negative  Skin: negative   Musculoskeletal: negative  Ears/Nose/Throat: negative  Respiratory: negative  Hematological/Lymphatic: negative  Psychological: negative    Physical Exam:    There were no vitals filed for this visit.  There is no height or weight on file to calculate BMI.  Patient is a 40 y.o. male in no acute distress and alert and oriented to person, place and time.      Assessment and Plan      1. Frequency of micturition    2. Erectile dysfunction, unspecified erectile dysfunction type    3. Sperm granuloma; left           Plan:      Return in about 1 year (around 09/15/2016).  Patient's OAB symptoms (frequency) are not bad enough to warrant medical Rx.   Patient instructed to avoid bladder irritants in diet such as coffee, tea, caffeine, alcohol, carbonated beverages, spicy/acidic foods.   Patient given an Rx for Cialis 10 mg as needed for Erectile Dysfunction.    Patient no longer has any pain from his excised left sperm granuloma.       Jeremiah Headings Tanelle Lanzo, MD FACS  09/16/2015 11:01 AM    2017 PQRS  Documentation: N/A

## 2016-05-13 ENCOUNTER — Other Ambulatory Visit: Payer: Self-pay | Admitting: Family Medicine

## 2016-05-13 ENCOUNTER — Ambulatory Visit
Admission: RE | Admit: 2016-05-13 | Discharge: 2016-05-13 | Disposition: A | Discharge status: Home / Self Care | Payer: 59 | Source: Ambulatory Visit | Attending: Family Medicine | Admitting: Family Medicine

## 2016-05-13 DIAGNOSIS — R52 Pain, unspecified: Secondary | ICD-10-CM

## 2017-08-14 ENCOUNTER — Ambulatory Visit
Admit: 2017-08-14 | Discharge: 2017-08-14 | Payer: PRIVATE HEALTH INSURANCE | Attending: Specialist | Primary: Family Medicine

## 2017-08-14 DIAGNOSIS — N528 Other male erectile dysfunction: Secondary | ICD-10-CM

## 2017-08-14 LAB — POCT URINALYSIS DIPSTICK W/O MICROSCOPE (AUTO)
Glucose, UA POC: NEGATIVE
Leukocytes, UA: NEGATIVE
Nitrite, UA: NEGATIVE
Protein, UA POC: NEGATIVE

## 2017-08-14 MED ORDER — TADALAFIL 20 MG PO TABS
20 MG | ORAL_TABLET | Freq: Every day | ORAL | 5 refills | Status: AC | PRN
Start: 2017-08-14 — End: ?

## 2017-08-14 NOTE — Progress Notes (Signed)
Jeremiah Goodman Sande Brothers, MD Goodman    Urology Office Progress Note    Patient:  Jeremiah Goodman  Date of Birth: Mar 07, 1975  Date: 08/14/2017    HISTORY OF PRESENT ILLNESS:   The patient is a 42 y.o. male who was last seen on 09/16/15.    Erectile Dysfunction:  Patient is here today for erectile dysfunction which started several year(s) ago.   Recently his ED symptoms: show no change  Currently sexually active? Yes  Sex drive/libido: normal  Current medical Rx for ED: Tadalafil (Cialis) 10 mg as needed for Erectile Dysfunction.  Associated Symptoms: No dysuria, gross hematuria.  Pain Severity: Pain Score:   0 - No pain/10    Summary of old records:   Bilateral Vasectomy 04/04/14  Painful left sperm granuloma: 04/28/15 excision of left sperm granuloma; pain resolved 09/16/15  OAB, Frequency every 1.5 hrs; 09/09/15 VD: MVV=500 mL, TVV=1285 mL/d, TV=8 voids/d, AVV=161 mL/void, Nx0.33, NPi=34%, NUP=50 mL.hr; avoid pop/tea; 09/16/15 PVR = 26.4 mL; uroflow: 228 mL, 16.3/9.5 mL/sec  ED: Viagra 50 mg didn't help; Cialis 10 mg worked well; Rx for Tadalafil (Cialis) 20 mg prn    Additional History: Patient's frequency not bothersome enough to warrant medical Rx.  He was given an Rx for Tadalafil (Cialis) 20 mg, 1/2 tab as needed for Erectile Dysfunction.  He has some intermittent left orchalgia after his Left inguinal hernia surgery; no testicular or epididymal pathology noted on today's PE.    Procedures Today: N/A    Urinalysis today:  Results for POC orders placed in visit on 08/14/17   POCT Urinalysis No Micro (Auto)   Result Value Ref Range    Color, UA Amber     Clarity, UA Clear     Glucose, UA POC Negative     Bilirubin, UA      Ketones, UA      Spec Grav, UA      Blood, UA POC Trace-intact     pH, UA      Protein, UA POC Negative     Urobilinogen, UA      Leukocytes, UA Negative     Nitrite, UA Negative      Last several PSA's:  No results found for: PSA  Last total testosterone:  No results found for: TESTOSTERONE    AUA  Symptom Score (08/14/2017):  INCOMPLETE EMPTYING: How often have you had the sensation of not emptying your bladder?: About Half the time  FREQUENCY: How often do you have to urinate less than every two hours?: More than Half the time  INTERMITTENCY: How often have you found you stopped and started again several times when you urinated?: Less than 1 to 5 times  URGENCY: How often have you found it difficult to postpone urination?: About Half the time  WEAK STREAM: How often have you had a weak urinary stream?: About Half the time  STRAINING: How often have you had to strain to start  urination?: Not at all  NOCTURIA: How many times did you typically get up at night to uriniate?: 1 Time  TOTAL I-PSS SCORE:: 15  How would you feel if you were to spend the rest of your life with your urinary condition?: Mixe    Last AUA Symptom Score (QOL): 22 (3)    Last BUN and creatinine:  No results found for: BUN  No results found for: CREATININE    Additional Lab/Culture results: none    Imaging Reviewed during this Office Visit: none  (results  were independently reviewed by physician and radiology report verified)    PAST MEDICAL, FAMILY AND SOCIAL HISTORY UPDATE:  Past Medical History:   Diagnosis Date   . Asthma    . Caffeine use     2 cans pop per day   . Diverticulitis    . GERD (gastroesophageal reflux disease)    . Melanoma [facility])      Past Surgical History:   Procedure Laterality Date   . COLONOSCOPY     . KNEE SURGERY     . OTHER SURGICAL HISTORY  04/28/2015    Excision of left sperm granuloma   . VASECTOMY Bilateral 04/04/14     Family History   Family history unknown: Yes     Current Outpatient Medications   Medication Sig Dispense Refill   . tadalafil (CIALIS) 20 MG tablet Take 1 tablet by mouth daily as needed for Erectile Dysfunction 10 tablet 5   . metoprolol succinate (TOPROL XL) 25 MG extended release tablet   1   . omeprazole (PRILOSEC) 10 MG delayed release capsule Take 10 mg by mouth     . DilTIAZem HCl POWD       . tadalafil (CIALIS) 10 MG tablet Take 1 tablet by mouth as needed for Erectile Dysfunction 30 tablet 3   . RA VITAMIN D-3 5000 units CAPS capsule   0   . azelastine (ASTELIN) 0.1 % nasal spray instill 1 spray into each nostril twice a day  0   . fluticasone (FLONASE) 50 MCG/ACT nasal spray instill 1 spray into each nostril twice a day  0   . PROAIR HFA 108 (90 BASE) MCG/ACT inhaler   0   . hydrOXYzine (ATARAX) 10 MG/5ML syrup   0     No current facility-administered medications for this visit.        Iodine and Other  Social History     Tobacco Use   Smoking Status Never Smoker   Smokeless Tobacco Never Used     (If patient a smoker, smoking cessation counseling offered)    Social History     Substance and Sexual Activity   Alcohol Use Yes   . Alcohol/week: 1.0 standard drinks   . Types: 1 Standard drinks or equivalent per week       REVIEW OF SYSTEMS (obtained by ancillary medical staff):  Constitutional: negative  Eyes: negative  Neurological: negative  Endocrine: negative  Gastrointestinal: negative  Cardiovascular: positive for  chest pain  Skin: negative   Musculoskeletal: negative  Ears/Nose/Throat: negative  Respiratory: negative  Hematological/Lymphatic: negative  Psychological: negative    Physical Exam:    There were no vitals filed for this visit.  Body mass index is 30.41 kg/m.  Patient is a 42 y.o. male in no acute distress and alert and oriented to person, place and time.  Normal left testicle and epididymis      Assessment and Plan      1. Other male erectile dysfunction    2. Frequency of micturition    3. Orchalgia; left           Plan:      Return if symptoms worsen or fail to improve.  Patient's frequency not bothersome enough to warrant medical Rx.    He was given an Rx for Tadalafil (Cialis) 20 mg, 1/2 tab as needed for Erectile Dysfunction.    He has some intermittent left orchalgia after his Left inguinal hernia surgery; no testicular or epididymal pathology noted on  today's PE.          Jeremiah Goodman Jeremiah Goodman  08/14/2017 1:54 PM    2019 PQRS Documentation: N/A

## 2018-03-30 DIAGNOSIS — Z0000 Encounter for general adult medical examination without abnormal findings: Secondary | ICD-10-CM | POA: Diagnosis not present

## 2018-03-30 DIAGNOSIS — Z13220 Encounter for screening for lipoid disorders: Secondary | ICD-10-CM | POA: Diagnosis not present

## 2018-10-11 IMAGING — CR DG FOOT COMPLETE 3+V*L*
3 series · 3 of 3 positions shown · non-contrast
Comparison: No recent prior .

CLINICAL DATA: Pain third fourth metatarsal phalangeal joints. No
injury.

EXAM:
LEFT FOOT - COMPLETE 3+ VIEW

[x foot ap left]
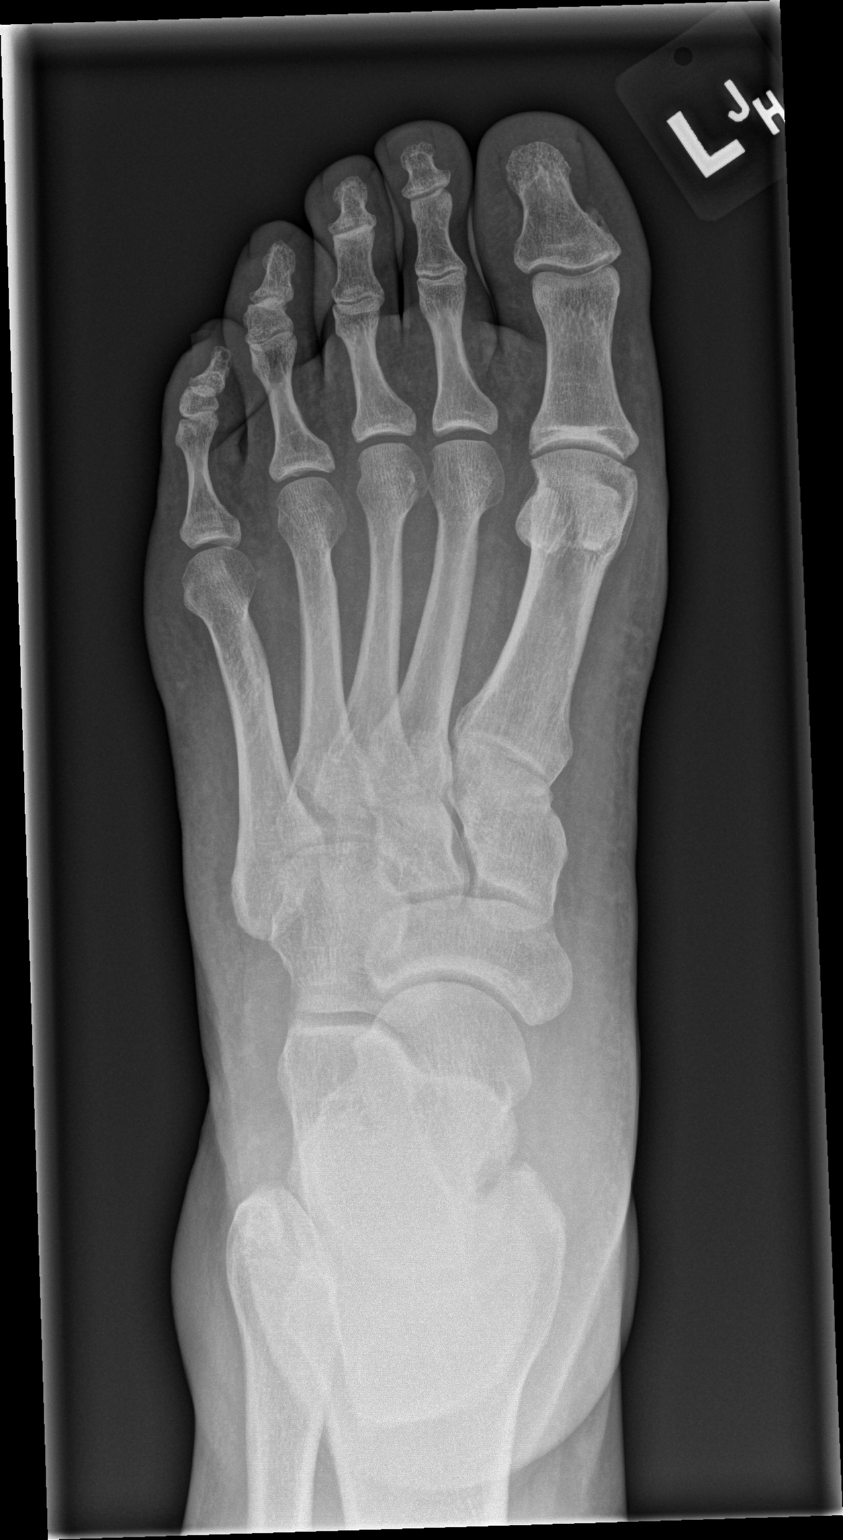

[x foot obl left]
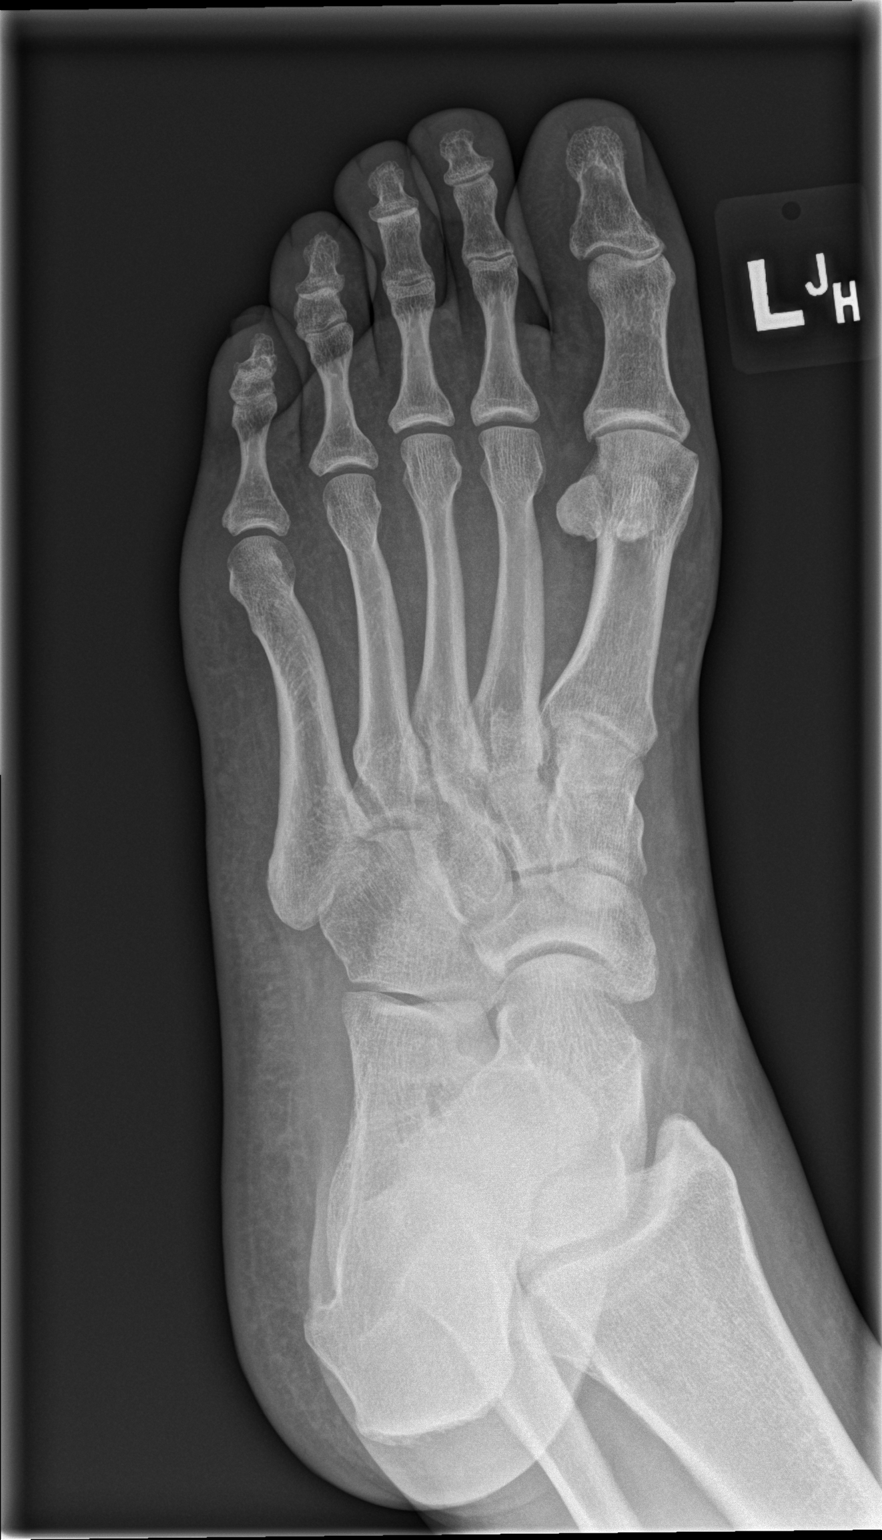

[x foot lat left]
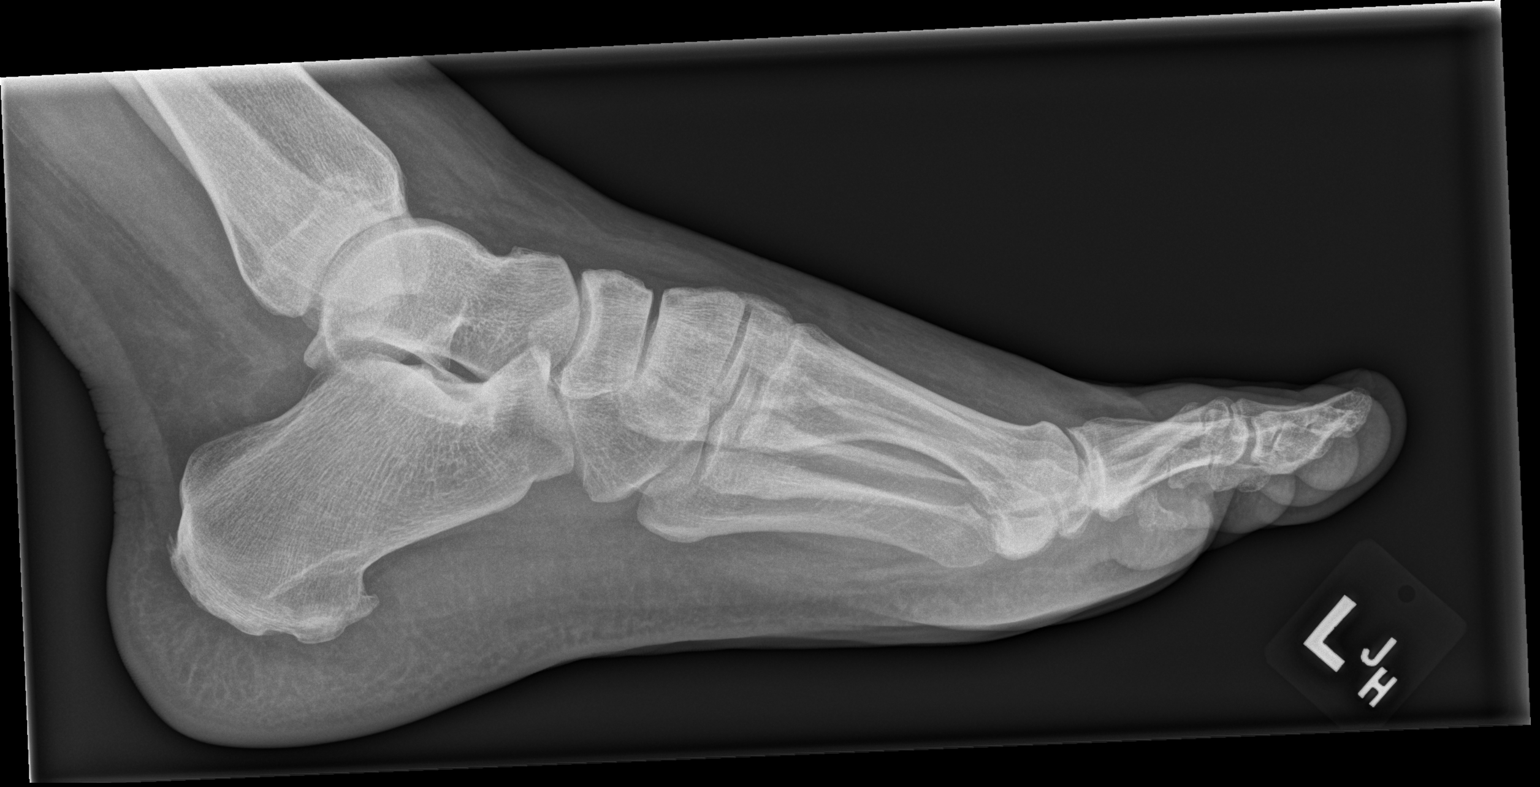

[3 of 3 positions shown; findings below may reference images not displayed]

FINDINGS: No acute bony or joint abnormality identified. No evidence of
fracture or dislocation. Degenerative changes first MTP joint.
IMPRESSION: No acute abnormality.

## 2019-03-14 DIAGNOSIS — Z20822 Contact with and (suspected) exposure to covid-19: Secondary | ICD-10-CM | POA: Diagnosis not present

## 2019-03-14 DIAGNOSIS — Z03818 Encounter for observation for suspected exposure to other biological agents ruled out: Secondary | ICD-10-CM | POA: Diagnosis not present

## 2019-04-08 DIAGNOSIS — Z20828 Contact with and (suspected) exposure to other viral communicable diseases: Secondary | ICD-10-CM | POA: Diagnosis not present

## 2019-04-08 DIAGNOSIS — Z03818 Encounter for observation for suspected exposure to other biological agents ruled out: Secondary | ICD-10-CM | POA: Diagnosis not present

## 2019-06-07 DIAGNOSIS — Z8042 Family history of malignant neoplasm of prostate: Secondary | ICD-10-CM | POA: Diagnosis not present

## 2019-06-07 DIAGNOSIS — Z13220 Encounter for screening for lipoid disorders: Secondary | ICD-10-CM | POA: Diagnosis not present

## 2019-06-07 DIAGNOSIS — Z0000 Encounter for general adult medical examination without abnormal findings: Secondary | ICD-10-CM | POA: Diagnosis not present

## 2019-06-12 ENCOUNTER — Other Ambulatory Visit: Payer: Self-pay | Admitting: Family Medicine

## 2019-06-12 DIAGNOSIS — R748 Abnormal levels of other serum enzymes: Secondary | ICD-10-CM

## 2019-06-21 DIAGNOSIS — R748 Abnormal levels of other serum enzymes: Secondary | ICD-10-CM | POA: Diagnosis not present

## 2019-06-27 ENCOUNTER — Other Ambulatory Visit: Payer: Self-pay

## 2019-06-27 ENCOUNTER — Ambulatory Visit
Admission: RE | Admit: 2019-06-27 | Discharge: 2019-06-27 | Disposition: A | Discharge status: Home / Self Care | Payer: BC Managed Care – PPO | Source: Ambulatory Visit | Attending: Family Medicine | Admitting: Family Medicine

## 2019-06-27 DIAGNOSIS — K824 Cholesterolosis of gallbladder: Secondary | ICD-10-CM | POA: Diagnosis not present

## 2019-06-27 DIAGNOSIS — R748 Abnormal levels of other serum enzymes: Secondary | ICD-10-CM

## 2019-07-03 DIAGNOSIS — L309 Dermatitis, unspecified: Secondary | ICD-10-CM | POA: Diagnosis not present

## 2019-07-03 DIAGNOSIS — J029 Acute pharyngitis, unspecified: Secondary | ICD-10-CM | POA: Diagnosis not present

## 2019-07-12 ENCOUNTER — Other Ambulatory Visit: Payer: Self-pay | Admitting: Family Medicine

## 2019-07-12 DIAGNOSIS — K760 Fatty (change of) liver, not elsewhere classified: Secondary | ICD-10-CM

## 2019-07-12 DIAGNOSIS — R748 Abnormal levels of other serum enzymes: Secondary | ICD-10-CM

## 2019-10-01 DIAGNOSIS — J209 Acute bronchitis, unspecified: Secondary | ICD-10-CM | POA: Diagnosis not present

## 2019-10-01 DIAGNOSIS — Z03818 Encounter for observation for suspected exposure to other biological agents ruled out: Secondary | ICD-10-CM | POA: Diagnosis not present

## 2019-10-28 DIAGNOSIS — J069 Acute upper respiratory infection, unspecified: Secondary | ICD-10-CM | POA: Diagnosis not present

## 2019-10-28 DIAGNOSIS — H6691 Otitis media, unspecified, right ear: Secondary | ICD-10-CM | POA: Diagnosis not present

## 2019-10-28 DIAGNOSIS — Z03818 Encounter for observation for suspected exposure to other biological agents ruled out: Secondary | ICD-10-CM | POA: Diagnosis not present

## 2019-10-30 DIAGNOSIS — J069 Acute upper respiratory infection, unspecified: Secondary | ICD-10-CM | POA: Diagnosis not present

## 2019-10-30 DIAGNOSIS — Z03818 Encounter for observation for suspected exposure to other biological agents ruled out: Secondary | ICD-10-CM | POA: Diagnosis not present

## 2019-12-06 DIAGNOSIS — R0981 Nasal congestion: Secondary | ICD-10-CM | POA: Diagnosis not present

## 2019-12-06 DIAGNOSIS — R051 Acute cough: Secondary | ICD-10-CM | POA: Diagnosis not present

## 2019-12-06 DIAGNOSIS — Z1152 Encounter for screening for COVID-19: Secondary | ICD-10-CM | POA: Diagnosis not present

## 2019-12-06 DIAGNOSIS — Z03818 Encounter for observation for suspected exposure to other biological agents ruled out: Secondary | ICD-10-CM | POA: Diagnosis not present

## 2019-12-10 ENCOUNTER — Other Ambulatory Visit (HOSPITAL_COMMUNITY): Payer: Self-pay | Admitting: Family

## 2019-12-10 DIAGNOSIS — U071 COVID-19: Secondary | ICD-10-CM

## 2019-12-10 NOTE — Progress Notes (Signed)
I connected by phone with Bryce Jackson on 12/10/2019 at 5:33 PM to discuss the potential use of a new treatment for mild to moderate COVID-19 viral infection in non-hospitalized patients.  This patient is a 44 y.o. male that meets the FDA criteria for Emergency Use Authorization of COVID monoclonal antibody casirivimab/imdevimab, bamlanivimab/eteseviamb, or sotrovimab.  Has a (+) direct SARS-CoV-2 viral test result  Has mild or moderate COVID-19   Is NOT hospitalized due to COVID-19  Is within 10 days of symptom onset  Has at least one of the high risk factor(s) for progression to severe COVID-19 and/or hospitalization as defined in EUA.  Specific high risk criteria : BMI > 25 Weight > 300 pounds. 6'1".   Symptoms of fever, loss of taste/smell, fatigue, congestion, cough began 12/05/19.   I have spoken and communicated the following to the patient or parent/caregiver regarding COVID monoclonal antibody treatment:  1. FDA has authorized the emergency use for the treatment of mild to moderate COVID-19 in adults and pediatric patients with positive results of direct SARS-CoV-2 viral testing who are 88 years of age and older weighing at least 40 kg, and who are at high risk for progressing to severe COVID-19 and/or hospitalization.  2. The significant known and potential risks and benefits of COVID monoclonal antibody, and the extent to which such potential risks and benefits are unknown.  3. Information on available alternative treatments and the risks and benefits of those alternatives, including clinical trials.  4. Patients treated with COVID monoclonal antibody should continue to self-isolate and use infection control measures (e.g., wear mask, isolate, social distance, avoid sharing personal items, clean and disinfect "high touch" surfaces, and frequent handwashing) according to CDC guidelines.   5. The patient or parent/caregiver has the option to accept or refuse COVID monoclonal  antibody treatment.  After reviewing this information with the patient, the patient has agreed to receive one of the available covid 19 monoclonal antibodies and will be provided an appropriate fact sheet prior to infusion. Bryce Stall, NP 12/10/2019 5:33 PM

## 2019-12-11 ENCOUNTER — Ambulatory Visit (HOSPITAL_COMMUNITY)
Admission: RE | Admit: 2019-12-11 | Discharge: 2019-12-11 | Disposition: A | Discharge status: Home / Self Care | Payer: BC Managed Care – PPO | Source: Ambulatory Visit | Attending: Pulmonary Disease | Admitting: Pulmonary Disease

## 2019-12-11 DIAGNOSIS — U071 COVID-19: Secondary | ICD-10-CM | POA: Insufficient documentation

## 2019-12-11 MED ORDER — ALBUTEROL SULFATE HFA 108 (90 BASE) MCG/ACT IN AERS: 2.0000 | INHALATION_SPRAY | Freq: Once | RESPIRATORY_TRACT | Status: DC | PRN

## 2019-12-11 MED ORDER — FAMOTIDINE IN NACL 20-0.9 MG/50ML-% IV SOLN: 20.0000 mg | Freq: Once | INTRAVENOUS | Status: DC | PRN

## 2019-12-11 MED ORDER — METHYLPREDNISOLONE SODIUM SUCC 125 MG IJ SOLR: 125.0000 mg | Freq: Once | INTRAMUSCULAR | Status: DC | PRN

## 2019-12-11 MED ORDER — SOTROVIMAB 500 MG/8ML IV SOLN
500.0000 mg | Freq: Once | INTRAVENOUS | Status: AC
  Administered 2019-12-11: 500 mg via INTRAVENOUS

## 2019-12-11 MED ORDER — SODIUM CHLORIDE 0.9 % IV SOLN: INTRAVENOUS | Status: DC | PRN

## 2019-12-11 MED ORDER — EPINEPHRINE 0.3 MG/0.3ML IJ SOAJ: 0.3000 mg | Freq: Once | INTRAMUSCULAR | Status: DC | PRN

## 2019-12-11 MED ORDER — DIPHENHYDRAMINE HCL 50 MG/ML IJ SOLN: 50.0000 mg | Freq: Once | INTRAMUSCULAR | Status: DC | PRN

## 2019-12-11 NOTE — Progress Notes (Signed)
Patient reviewed Fact Sheet for Patients, Parents, and Caregivers for Emergency Use Authorization (EUA) of Sotrovimab for the Treatment of Coronavirus. Patient also reviewed and is agreeable to the estimated cost of treatment. Patient is agreeable to proceed.   

## 2019-12-11 NOTE — Discharge Instructions (Signed)

## 2019-12-11 NOTE — Progress Notes (Signed)
  Diagnosis: COVID-19  Physician:dr wright  Procedure:Diagnosis: COVID-19  Physician: Dr. Patrick Wright  Procedure: [facility] Med: Sotrovimab infusion - Provided patient with sotrovimab fact sheet for patients, parents, and caregivers prior to infusion.   Complications: No immediate complications noted  Discharge: Discharged home  If after the infusion you have any questions or concerns please call the Advanced Practice Provider at [phone]  Complications: No immediate complications noted.  Discharge: Discharged home   Keniyah Gelinas S Marsha Hillman 12/11/2019  

## 2020-03-18 DIAGNOSIS — S92512A Displaced fracture of proximal phalanx of left lesser toe(s), initial encounter for closed fracture: Secondary | ICD-10-CM | POA: Diagnosis not present

## 2020-03-18 DIAGNOSIS — S9032XA Contusion of left foot, initial encounter: Secondary | ICD-10-CM | POA: Diagnosis not present

## 2020-03-18 DIAGNOSIS — H1031 Unspecified acute conjunctivitis, right eye: Secondary | ICD-10-CM | POA: Diagnosis not present

## 2020-03-19 DIAGNOSIS — S92512A Displaced fracture of proximal phalanx of left lesser toe(s), initial encounter for closed fracture: Secondary | ICD-10-CM | POA: Diagnosis not present

## 2020-04-02 DIAGNOSIS — S92512A Displaced fracture of proximal phalanx of left lesser toe(s), initial encounter for closed fracture: Secondary | ICD-10-CM | POA: Diagnosis not present

## 2020-04-30 DIAGNOSIS — S92512A Displaced fracture of proximal phalanx of left lesser toe(s), initial encounter for closed fracture: Secondary | ICD-10-CM | POA: Diagnosis not present

## 2020-06-10 MED FILL — ONDANSETRON HCL 4MG TABS: 4 mg | ORAL | 10 days supply | Qty: 10 | Fill #0 | Status: AC

## 2020-06-10 MED FILL — PAXLOVID 20 X 150 MG & TBPK: 20 x 150 MG & 10 x 100MG | ORAL | 5 days supply | Qty: 30 | Fill #0 | Status: AC

## 2020-06-10 MED FILL — FLOWFLEX COVID-19 AG HOME TES  KIT: 2 days supply | Qty: 2 | Fill #0 | Status: AC

## 2020-06-17 DIAGNOSIS — L218 Other seborrheic dermatitis: Secondary | ICD-10-CM | POA: Diagnosis not present

## 2020-06-17 DIAGNOSIS — H0012 Chalazion right lower eyelid: Secondary | ICD-10-CM | POA: Diagnosis not present

## 2020-06-17 DIAGNOSIS — L02821 Furuncle of head [any part, except face]: Secondary | ICD-10-CM | POA: Diagnosis not present

## 2020-06-17 DIAGNOSIS — L700 Acne vulgaris: Secondary | ICD-10-CM | POA: Diagnosis not present

## 2020-08-20 DIAGNOSIS — L700 Acne vulgaris: Secondary | ICD-10-CM | POA: Diagnosis not present

## 2020-08-20 DIAGNOSIS — L218 Other seborrheic dermatitis: Secondary | ICD-10-CM | POA: Diagnosis not present

## 2020-08-20 DIAGNOSIS — L02223 Furuncle of chest wall: Secondary | ICD-10-CM | POA: Diagnosis not present

## 2020-08-20 DIAGNOSIS — L02821 Furuncle of head [any part, except face]: Secondary | ICD-10-CM | POA: Diagnosis not present

## 2020-12-23 DIAGNOSIS — Z03818 Encounter for observation for suspected exposure to other biological agents ruled out: Secondary | ICD-10-CM | POA: Diagnosis not present

## 2020-12-23 DIAGNOSIS — J069 Acute upper respiratory infection, unspecified: Secondary | ICD-10-CM | POA: Diagnosis not present

## 2020-12-23 DIAGNOSIS — R5383 Other fatigue: Secondary | ICD-10-CM | POA: Diagnosis not present

## 2020-12-23 DIAGNOSIS — R519 Headache, unspecified: Secondary | ICD-10-CM | POA: Diagnosis not present

## 2021-01-25 DIAGNOSIS — K219 Gastro-esophageal reflux disease without esophagitis: Secondary | ICD-10-CM | POA: Diagnosis not present

## 2021-01-25 DIAGNOSIS — R079 Chest pain, unspecified: Secondary | ICD-10-CM | POA: Diagnosis not present

## 2021-01-26 DIAGNOSIS — M25561 Pain in right knee: Secondary | ICD-10-CM | POA: Diagnosis not present

## 2021-02-09 DIAGNOSIS — Z13220 Encounter for screening for lipoid disorders: Secondary | ICD-10-CM | POA: Diagnosis not present

## 2021-02-09 DIAGNOSIS — Z125 Encounter for screening for malignant neoplasm of prostate: Secondary | ICD-10-CM | POA: Diagnosis not present

## 2021-02-09 DIAGNOSIS — K219 Gastro-esophageal reflux disease without esophagitis: Secondary | ICD-10-CM | POA: Diagnosis not present

## 2021-02-09 DIAGNOSIS — F419 Anxiety disorder, unspecified: Secondary | ICD-10-CM | POA: Diagnosis not present

## 2021-02-09 DIAGNOSIS — K580 Irritable bowel syndrome with diarrhea: Secondary | ICD-10-CM | POA: Diagnosis not present

## 2021-02-09 DIAGNOSIS — Z0000 Encounter for general adult medical examination without abnormal findings: Secondary | ICD-10-CM | POA: Diagnosis not present

## 2021-06-21 DIAGNOSIS — R202 Paresthesia of skin: Secondary | ICD-10-CM | POA: Diagnosis not present

## 2021-06-21 DIAGNOSIS — Z23 Encounter for immunization: Secondary | ICD-10-CM | POA: Diagnosis not present

## 2021-06-21 DIAGNOSIS — S61212A Laceration without foreign body of right middle finger without damage to nail, initial encounter: Secondary | ICD-10-CM | POA: Diagnosis not present

## 2021-06-21 DIAGNOSIS — R200 Anesthesia of skin: Secondary | ICD-10-CM | POA: Diagnosis not present

## 2021-06-21 DIAGNOSIS — Z87891 Personal history of nicotine dependence: Secondary | ICD-10-CM | POA: Diagnosis not present

## 2021-06-21 DIAGNOSIS — Y93G1 Activity, food preparation and clean up: Secondary | ICD-10-CM | POA: Diagnosis not present

## 2021-06-21 DIAGNOSIS — W25XXXA Contact with sharp glass, initial encounter: Secondary | ICD-10-CM | POA: Diagnosis not present

## 2021-06-21 DIAGNOSIS — Z79899 Other long term (current) drug therapy: Secondary | ICD-10-CM | POA: Diagnosis not present

## 2021-07-03 DIAGNOSIS — S61312A Laceration without foreign body of right middle finger with damage to nail, initial encounter: Secondary | ICD-10-CM | POA: Diagnosis not present

## 2021-09-08 DIAGNOSIS — J029 Acute pharyngitis, unspecified: Secondary | ICD-10-CM | POA: Diagnosis not present

## 2021-09-08 DIAGNOSIS — U071 COVID-19: Secondary | ICD-10-CM | POA: Diagnosis not present

## 2021-11-24 IMAGING — US US ABDOMEN COMPLETE
1 series · 14 of 25 positions shown · non-contrast
Comparison: None.

CLINICAL DATA: 43-year-old male with abnormal LFTs.

EXAM:
ABDOMEN ULTRASOUND COMPLETE

[Series 1: us abdomen complete · 0.19mm/px · 14 of 96 slices shown]
[im 1/96]
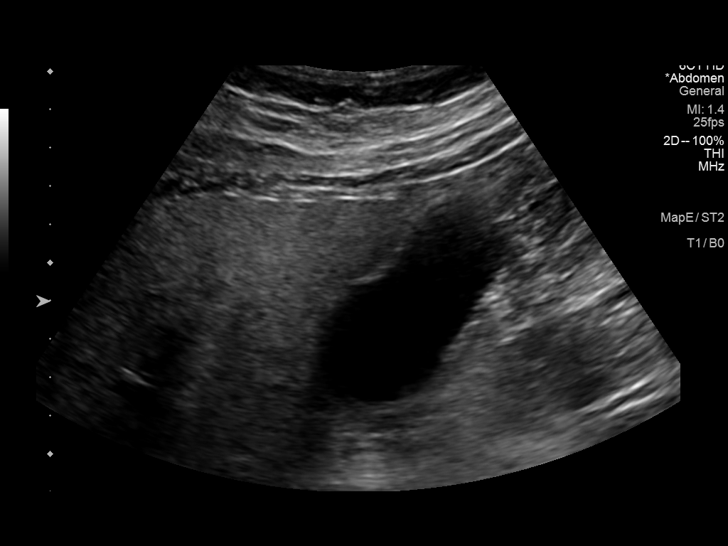
[im 8/96]
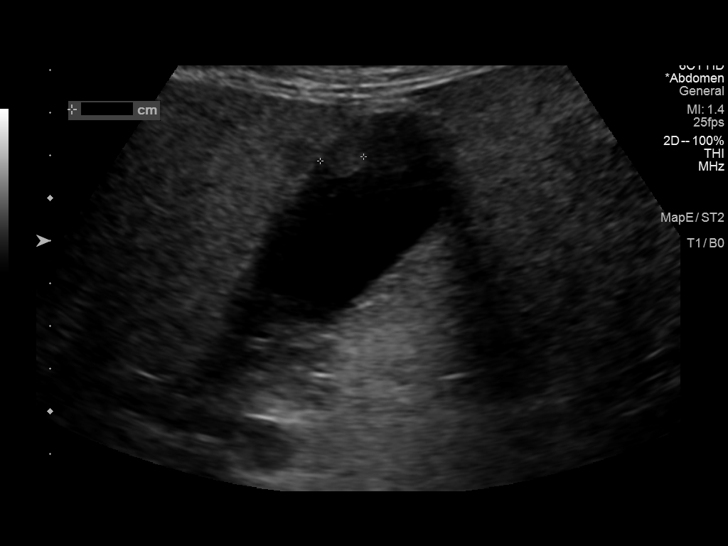
[im 16/96]
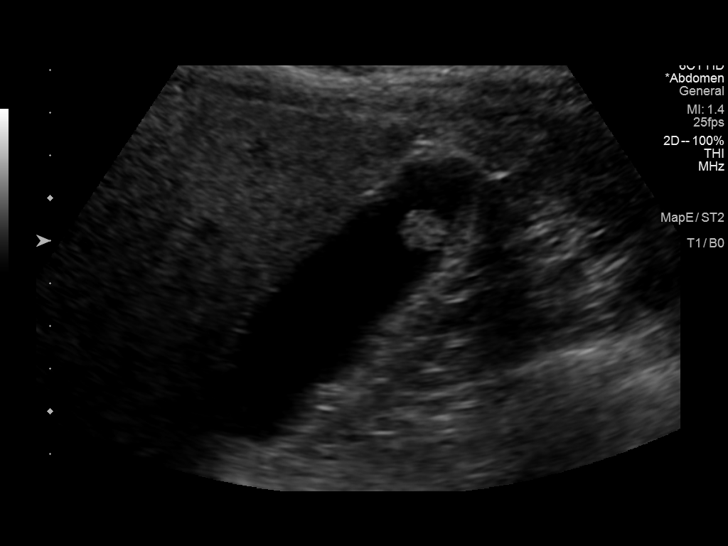
[im 24/96]
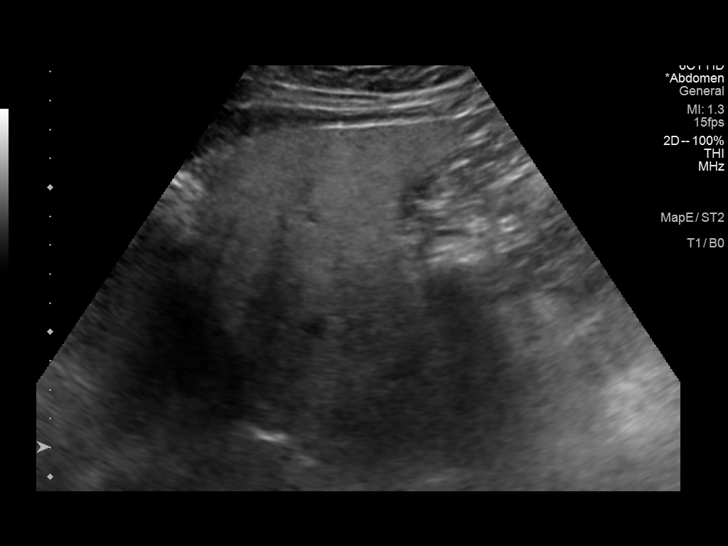
[im 32/96]
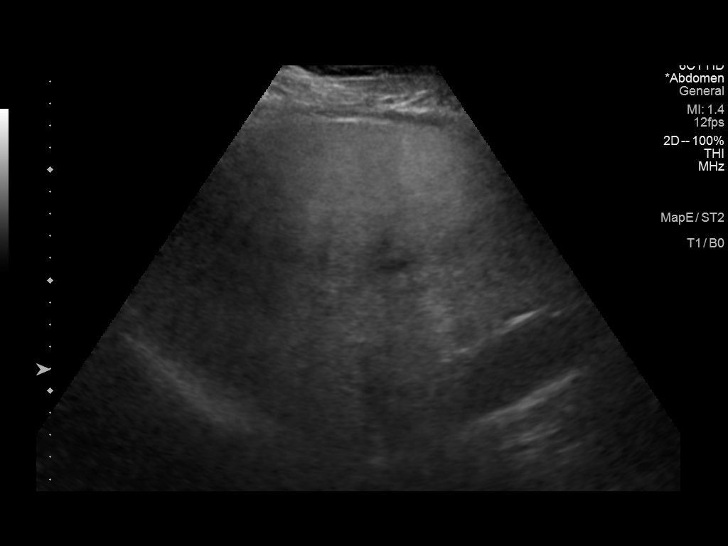
[im 36/96]
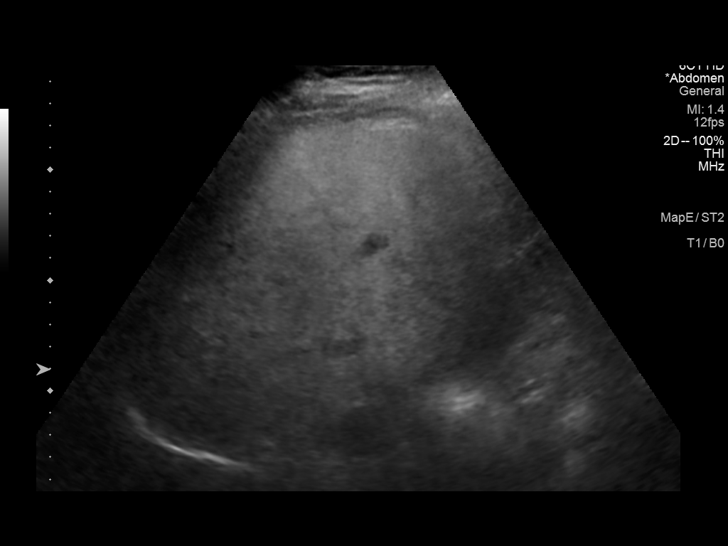
[im 44/96]
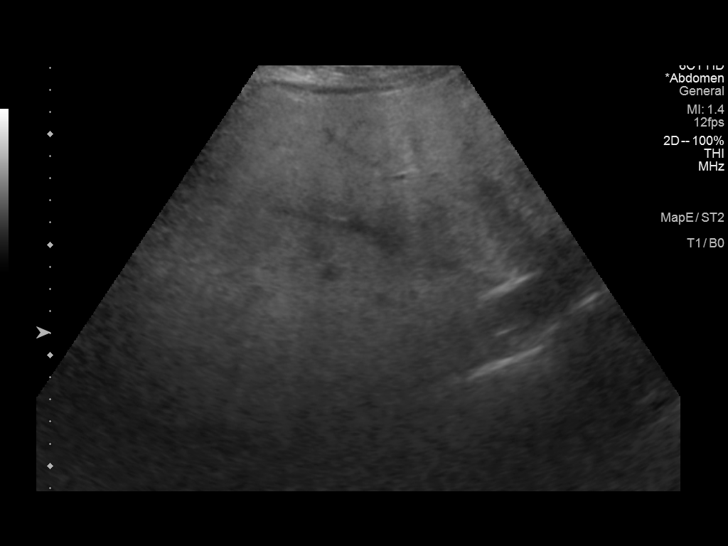
[im 52/96]
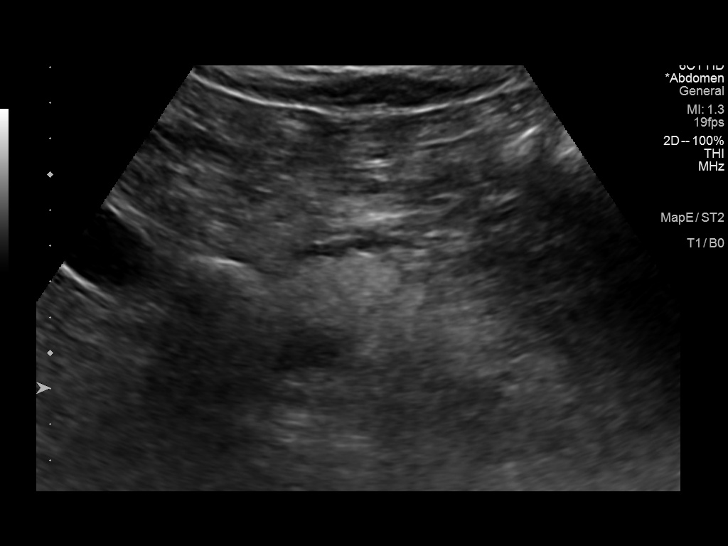
[im 60/96]
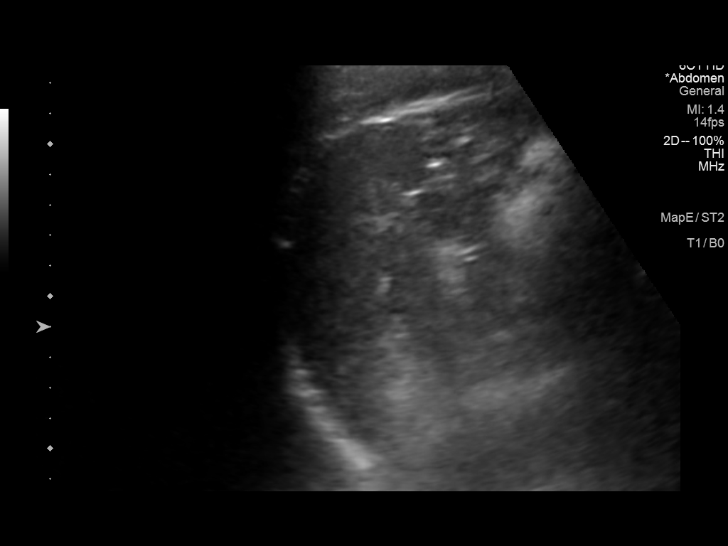
[im 64/96]
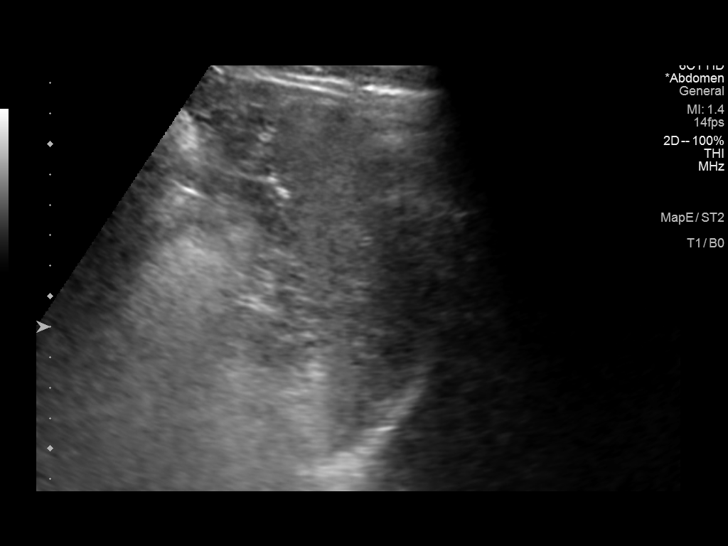
[im 72/96]
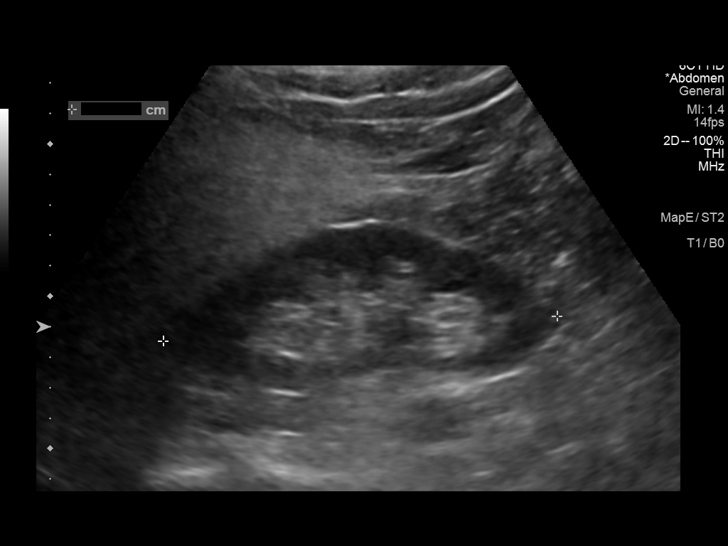
[im 80/96]
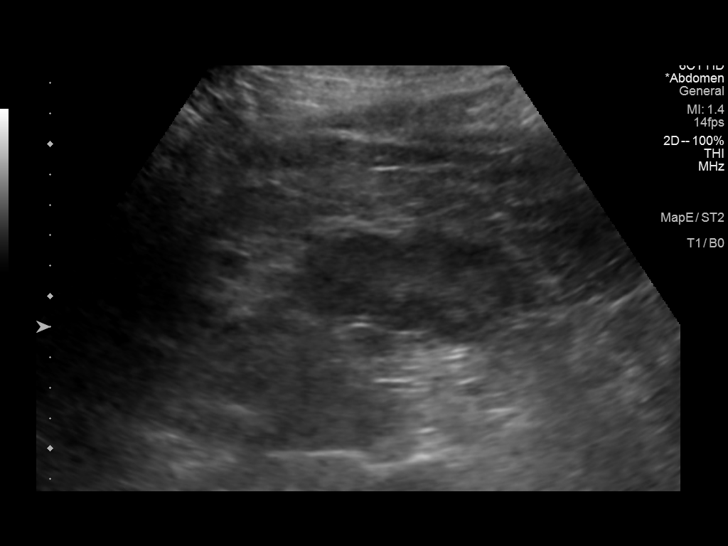
[im 88/96]
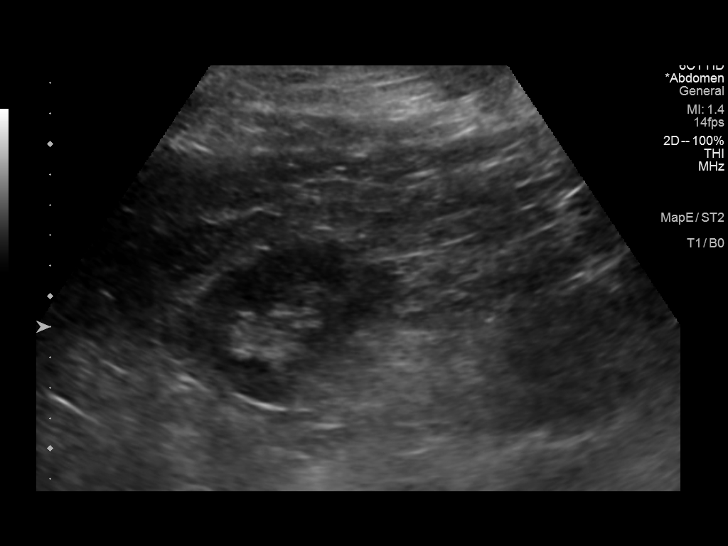
[im 96/96]
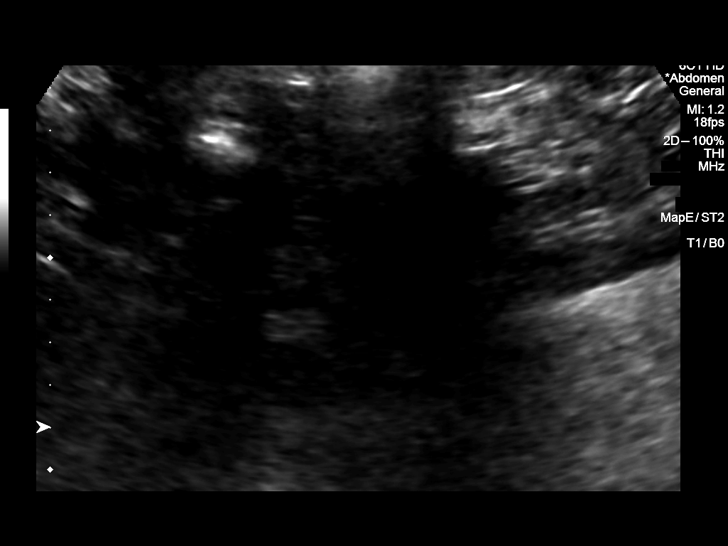

[14 of 25 positions shown; findings below may reference images not displayed]

FINDINGS: Gallbladder: A 1 cm gallbladder polyp is noted. Gallbladder sludge
is present. There is no evidence of cholelithiasis or acute
cholecystitis.

Common bile duct: Diameter: 3 mm.

Liver: Markedly increased echogenicity liver is noted compatible
with hepatic steatosis. No focal hepatic abnormalities are present.
Portal vein is patent on color Doppler imaging with normal direction
of blood flow towards the liver.

IVC: No abnormality visualized.

Pancreas: Visualized portion unremarkable.

Spleen: Size and appearance within normal limits.

Right Kidney: Length: 13 cm. Echogenicity within normal limits. No
mass or hydronephrosis visualized.

Left Kidney: Length: 11.8 cm. Echogenicity within normal limits. No
mass or hydronephrosis visualized.

Abdominal aorta: No aneurysm visualized.

Other findings: None.
IMPRESSION: 1. Markedly increased hepatic echogenicity compatible with hepatic
steatosis.
2. 1 cm gallbladder polyp. No evidence of cholelithiasis or acute
cholecystitis. No biliary dilatation.

## 2022-01-14 DIAGNOSIS — Z20822 Contact with and (suspected) exposure to covid-19: Secondary | ICD-10-CM | POA: Diagnosis not present

## 2022-01-14 DIAGNOSIS — Z8616 Personal history of COVID-19: Secondary | ICD-10-CM | POA: Diagnosis not present

## 2022-01-14 DIAGNOSIS — J101 Influenza due to other identified influenza virus with other respiratory manifestations: Secondary | ICD-10-CM | POA: Diagnosis not present

## 2022-01-27 DIAGNOSIS — H01001 Unspecified blepharitis right upper eyelid: Secondary | ICD-10-CM | POA: Diagnosis not present

## 2022-08-03 DIAGNOSIS — R079 Chest pain, unspecified: Secondary | ICD-10-CM | POA: Diagnosis not present

## 2022-08-03 DIAGNOSIS — R9431 Abnormal electrocardiogram [ECG] [EKG]: Secondary | ICD-10-CM | POA: Diagnosis not present

## 2022-08-03 DIAGNOSIS — F419 Anxiety disorder, unspecified: Secondary | ICD-10-CM | POA: Diagnosis not present

## 2022-08-03 DIAGNOSIS — Z87891 Personal history of nicotine dependence: Secondary | ICD-10-CM | POA: Diagnosis not present

## 2022-08-03 DIAGNOSIS — I4510 Unspecified right bundle-branch block: Secondary | ICD-10-CM | POA: Diagnosis not present

## 2022-08-03 DIAGNOSIS — I4519 Other right bundle-branch block: Secondary | ICD-10-CM | POA: Diagnosis not present

## 2022-08-03 DIAGNOSIS — R0789 Other chest pain: Secondary | ICD-10-CM | POA: Diagnosis not present

## 2022-08-03 DIAGNOSIS — R002 Palpitations: Secondary | ICD-10-CM | POA: Diagnosis not present

## 2022-08-10 DIAGNOSIS — R002 Palpitations: Secondary | ICD-10-CM | POA: Diagnosis not present

## 2022-08-10 DIAGNOSIS — F411 Generalized anxiety disorder: Secondary | ICD-10-CM | POA: Diagnosis not present

## 2022-08-10 DIAGNOSIS — Z1331 Encounter for screening for depression: Secondary | ICD-10-CM | POA: Diagnosis not present

## 2022-08-10 DIAGNOSIS — R748 Abnormal levels of other serum enzymes: Secondary | ICD-10-CM | POA: Diagnosis not present

## 2022-08-10 DIAGNOSIS — K219 Gastro-esophageal reflux disease without esophagitis: Secondary | ICD-10-CM | POA: Diagnosis not present

## 2022-09-27 DIAGNOSIS — F4310 Post-traumatic stress disorder, unspecified: Secondary | ICD-10-CM | POA: Diagnosis not present

## 2022-09-27 DIAGNOSIS — F4001 Agoraphobia with panic disorder: Secondary | ICD-10-CM | POA: Diagnosis not present

## 2022-10-20 DIAGNOSIS — F4310 Post-traumatic stress disorder, unspecified: Secondary | ICD-10-CM | POA: Diagnosis not present

## 2022-10-20 DIAGNOSIS — F4001 Agoraphobia with panic disorder: Secondary | ICD-10-CM | POA: Diagnosis not present

## 2023-01-17 DIAGNOSIS — H66003 Acute suppurative otitis media without spontaneous rupture of ear drum, bilateral: Secondary | ICD-10-CM | POA: Diagnosis not present

## 2023-01-17 DIAGNOSIS — H1032 Unspecified acute conjunctivitis, left eye: Secondary | ICD-10-CM | POA: Diagnosis not present
